# Patient Record
Sex: Male | Born: 1950 | Race: White | Hispanic: No | Marital: Married | Smoking: Never smoker
Health system: Southern US, Community
[De-identification: ages and names within clinical notes are randomized; demographics above are authoritative.]

## PROBLEM LIST (undated history)

## (undated) DIAGNOSIS — K5790 Diverticulosis of intestine, part unspecified, without perforation or abscess without bleeding: Secondary | ICD-10-CM

## (undated) DIAGNOSIS — C801 Malignant (primary) neoplasm, unspecified: Secondary | ICD-10-CM

## (undated) DIAGNOSIS — C20 Malignant neoplasm of rectum: Principal | ICD-10-CM

## (undated) DIAGNOSIS — J930 Spontaneous tension pneumothorax: Secondary | ICD-10-CM

## (undated) DIAGNOSIS — I10 Essential (primary) hypertension: Secondary | ICD-10-CM

## (undated) DIAGNOSIS — Z85048 Personal history of other malignant neoplasm of rectum, rectosigmoid junction, and anus: Secondary | ICD-10-CM

## (undated) DIAGNOSIS — R198 Other specified symptoms and signs involving the digestive system and abdomen: Secondary | ICD-10-CM

## (undated) DIAGNOSIS — Z85038 Personal history of other malignant neoplasm of large intestine: Secondary | ICD-10-CM

## (undated) DIAGNOSIS — M51379 Other intervertebral disc degeneration, lumbosacral region without mention of lumbar back pain or lower extremity pain: Secondary | ICD-10-CM

## (undated) DIAGNOSIS — I2510 Atherosclerotic heart disease of native coronary artery without angina pectoris: Secondary | ICD-10-CM

## (undated) DIAGNOSIS — Z8719 Personal history of other diseases of the digestive system: Secondary | ICD-10-CM

## (undated) DIAGNOSIS — K219 Gastro-esophageal reflux disease without esophagitis: Secondary | ICD-10-CM

## (undated) DIAGNOSIS — M5137 Other intervertebral disc degeneration, lumbosacral region: Secondary | ICD-10-CM

## (undated) DIAGNOSIS — E785 Hyperlipidemia, unspecified: Secondary | ICD-10-CM

## (undated) DIAGNOSIS — D131 Benign neoplasm of stomach: Secondary | ICD-10-CM

## (undated) DIAGNOSIS — M5030 Other cervical disc degeneration, unspecified cervical region: Secondary | ICD-10-CM

## (undated) DIAGNOSIS — K625 Hemorrhage of anus and rectum: Secondary | ICD-10-CM

## (undated) DIAGNOSIS — D126 Benign neoplasm of colon, unspecified: Secondary | ICD-10-CM

## (undated) HISTORY — DX: Malignant (primary) neoplasm, unspecified: C80.1

## (undated) HISTORY — PX: COLON SURGERY: SHX602

## (undated) HISTORY — PX: OTHER SURGICAL HISTORY: SHX169

## (undated) HISTORY — DX: Malignant neoplasm of rectum: C20

## (undated) HISTORY — DX: Essential (primary) hypertension: I10

## (undated) HISTORY — DX: Gastro-esophageal reflux disease without esophagitis: K21.9

---

## 2008-05-08 ENCOUNTER — Ambulatory Visit: Payer: Self-pay | Admitting: Radiation Oncology

## 2008-06-04 ENCOUNTER — Inpatient Hospital Stay: Payer: Self-pay | Admitting: Gastroenterology

## 2008-06-05 ENCOUNTER — Ambulatory Visit: Payer: Self-pay | Admitting: Radiation Oncology

## 2008-06-07 ENCOUNTER — Ambulatory Visit: Payer: Self-pay | Admitting: Internal Medicine

## 2008-06-10 ENCOUNTER — Ambulatory Visit: Payer: Self-pay | Admitting: Internal Medicine

## 2008-06-11 ENCOUNTER — Ambulatory Visit: Payer: Self-pay | Admitting: Internal Medicine

## 2008-07-08 ENCOUNTER — Ambulatory Visit: Payer: Self-pay | Admitting: Internal Medicine

## 2008-07-11 ENCOUNTER — Emergency Department: Payer: Self-pay | Admitting: Emergency Medicine

## 2008-08-07 ENCOUNTER — Ambulatory Visit: Payer: Self-pay | Admitting: Internal Medicine

## 2008-09-05 ENCOUNTER — Ambulatory Visit: Payer: Self-pay | Admitting: Surgery

## 2008-09-07 ENCOUNTER — Ambulatory Visit: Payer: Self-pay | Admitting: Internal Medicine

## 2008-09-12 ENCOUNTER — Inpatient Hospital Stay: Payer: Self-pay | Admitting: Surgery

## 2008-10-06 ENCOUNTER — Ambulatory Visit: Payer: Self-pay | Admitting: Surgery

## 2008-10-08 ENCOUNTER — Ambulatory Visit: Payer: Self-pay | Admitting: Internal Medicine

## 2008-10-10 ENCOUNTER — Ambulatory Visit: Payer: Self-pay | Admitting: Internal Medicine

## 2008-11-07 ENCOUNTER — Ambulatory Visit: Payer: Self-pay | Admitting: Internal Medicine

## 2008-12-08 ENCOUNTER — Ambulatory Visit: Payer: Self-pay | Admitting: Internal Medicine

## 2009-01-07 ENCOUNTER — Ambulatory Visit: Payer: Self-pay | Admitting: Internal Medicine

## 2009-02-07 ENCOUNTER — Ambulatory Visit: Payer: Self-pay | Admitting: Internal Medicine

## 2009-02-09 ENCOUNTER — Ambulatory Visit: Payer: Self-pay | Admitting: Cardiology

## 2009-03-10 ENCOUNTER — Ambulatory Visit: Payer: Self-pay | Admitting: Internal Medicine

## 2009-04-07 ENCOUNTER — Ambulatory Visit: Payer: Self-pay | Admitting: Internal Medicine

## 2009-05-08 ENCOUNTER — Ambulatory Visit: Payer: Self-pay | Admitting: Internal Medicine

## 2009-06-07 ENCOUNTER — Ambulatory Visit: Payer: Self-pay | Admitting: Internal Medicine

## 2009-07-08 ENCOUNTER — Ambulatory Visit: Payer: Self-pay | Admitting: Internal Medicine

## 2009-08-07 ENCOUNTER — Ambulatory Visit: Payer: Self-pay | Admitting: Internal Medicine

## 2009-09-07 ENCOUNTER — Ambulatory Visit: Payer: Self-pay | Admitting: Internal Medicine

## 2009-09-27 LAB — CEA: CEA: 3.4 ng/mL (ref 0.0–4.7)

## 2009-10-08 ENCOUNTER — Ambulatory Visit: Payer: Self-pay | Admitting: Internal Medicine

## 2009-10-20 ENCOUNTER — Ambulatory Visit: Payer: Self-pay | Admitting: Gastroenterology

## 2009-11-07 ENCOUNTER — Ambulatory Visit: Payer: Self-pay | Admitting: Internal Medicine

## 2009-11-27 LAB — CEA: CEA: 3.2 ng/mL (ref 0.0–4.7)

## 2009-12-08 ENCOUNTER — Ambulatory Visit: Payer: Self-pay | Admitting: Internal Medicine

## 2010-01-07 ENCOUNTER — Ambulatory Visit: Payer: Self-pay | Admitting: Internal Medicine

## 2010-02-07 ENCOUNTER — Ambulatory Visit: Payer: Self-pay | Admitting: Internal Medicine

## 2010-03-10 ENCOUNTER — Ambulatory Visit: Payer: Self-pay | Admitting: Internal Medicine

## 2010-04-08 ENCOUNTER — Ambulatory Visit: Payer: Self-pay | Admitting: Internal Medicine

## 2010-05-09 ENCOUNTER — Ambulatory Visit: Payer: Self-pay | Admitting: Internal Medicine

## 2010-06-08 ENCOUNTER — Ambulatory Visit: Payer: Self-pay | Admitting: Internal Medicine

## 2010-07-09 ENCOUNTER — Ambulatory Visit: Payer: Self-pay | Admitting: Internal Medicine

## 2010-08-08 ENCOUNTER — Ambulatory Visit: Payer: Self-pay | Admitting: Internal Medicine

## 2010-09-08 ENCOUNTER — Ambulatory Visit: Payer: Self-pay | Admitting: Internal Medicine

## 2010-10-09 ENCOUNTER — Ambulatory Visit: Payer: Self-pay | Admitting: Internal Medicine

## 2010-11-08 ENCOUNTER — Ambulatory Visit: Payer: Self-pay | Admitting: Internal Medicine

## 2010-11-25 LAB — PSA: PSA: 1.1 ng/mL (ref 0.0–4.0)

## 2010-12-09 ENCOUNTER — Ambulatory Visit: Payer: Self-pay | Admitting: Internal Medicine

## 2010-12-23 LAB — CEA: CEA: 3.6 ng/mL (ref 0.0–4.7)

## 2011-01-08 ENCOUNTER — Ambulatory Visit: Payer: Self-pay | Admitting: Internal Medicine

## 2011-02-08 ENCOUNTER — Ambulatory Visit: Payer: Self-pay | Admitting: Internal Medicine

## 2011-02-16 ENCOUNTER — Ambulatory Visit: Payer: Self-pay | Admitting: Internal Medicine

## 2011-02-22 ENCOUNTER — Ambulatory Visit: Payer: Self-pay | Admitting: Internal Medicine

## 2011-03-11 ENCOUNTER — Ambulatory Visit: Payer: Self-pay | Admitting: Internal Medicine

## 2011-03-17 LAB — CEA: CEA: 3.5 ng/mL (ref 0.0–4.7)

## 2011-04-08 ENCOUNTER — Ambulatory Visit: Payer: Self-pay | Admitting: Internal Medicine

## 2011-05-09 ENCOUNTER — Ambulatory Visit: Payer: Self-pay | Admitting: Internal Medicine

## 2011-05-19 LAB — HEPATIC FUNCTION PANEL A (ARMC)
Albumin: 3.7 g/dL (ref 3.4–5.0)
Bilirubin, Direct: 0.2 mg/dL (ref 0.00–0.20)
Bilirubin,Total: 0.4 mg/dL (ref 0.2–1.0)
SGOT(AST): 18 U/L (ref 15–37)
SGPT (ALT): 28 U/L
Total Protein: 7.1 g/dL (ref 6.4–8.2)

## 2011-05-20 LAB — CEA: CEA: 3.8 ng/mL

## 2011-06-08 ENCOUNTER — Ambulatory Visit: Payer: Self-pay | Admitting: Internal Medicine

## 2011-07-09 ENCOUNTER — Ambulatory Visit: Payer: Self-pay | Admitting: Internal Medicine

## 2011-07-22 LAB — CEA: CEA: 4 ng/mL (ref 0.0–4.7)

## 2011-08-08 ENCOUNTER — Ambulatory Visit: Payer: Self-pay | Admitting: Internal Medicine

## 2011-09-08 ENCOUNTER — Ambulatory Visit: Payer: Self-pay

## 2011-09-08 ENCOUNTER — Ambulatory Visit: Payer: Self-pay | Admitting: Internal Medicine

## 2011-10-13 ENCOUNTER — Ambulatory Visit: Payer: Self-pay | Admitting: Internal Medicine

## 2011-10-18 ENCOUNTER — Ambulatory Visit: Payer: Self-pay | Admitting: Gastroenterology

## 2011-10-18 HISTORY — PX: ABDOMINOPERINEAL PROCTOCOLECTOMY: SUR8

## 2011-11-08 ENCOUNTER — Ambulatory Visit: Payer: Self-pay | Admitting: Internal Medicine

## 2011-11-10 LAB — HEPATIC FUNCTION PANEL A (ARMC)
Albumin: 3.8 g/dL (ref 3.4–5.0)
Bilirubin, Direct: 0.1 mg/dL (ref 0.00–0.20)
Bilirubin,Total: 0.5 mg/dL (ref 0.2–1.0)
SGOT(AST): 17 U/L (ref 15–37)
SGPT (ALT): 22 U/L (ref 12–78)

## 2011-12-09 ENCOUNTER — Ambulatory Visit: Payer: Self-pay

## 2011-12-09 ENCOUNTER — Ambulatory Visit: Payer: Self-pay | Admitting: Internal Medicine

## 2012-01-08 ENCOUNTER — Ambulatory Visit: Payer: Self-pay | Admitting: Internal Medicine

## 2012-02-08 ENCOUNTER — Ambulatory Visit: Payer: Self-pay | Admitting: Internal Medicine

## 2012-02-16 LAB — CREATININE, SERUM: EGFR (Non-African Amer.): >60

## 2012-03-10 ENCOUNTER — Ambulatory Visit: Payer: Self-pay

## 2012-03-10 ENCOUNTER — Ambulatory Visit: Payer: Self-pay | Admitting: Internal Medicine

## 2012-03-15 LAB — CREATININE, SERUM
Creatinine: 1.01 mg/dL (ref 0.60–1.30)
EGFR (African American): >60
EGFR (Non-African Amer.): >60

## 2012-03-15 LAB — CANCER CENTER HEMOGLOBIN: HGB: 15.8 g/dL (ref 13.0–18.0)

## 2012-04-07 ENCOUNTER — Ambulatory Visit: Payer: Self-pay | Admitting: Internal Medicine

## 2012-05-08 ENCOUNTER — Ambulatory Visit: Payer: Self-pay | Admitting: Internal Medicine

## 2012-05-11 LAB — CEA: CEA: 4.4 ng/mL (ref 0.0–4.7)

## 2012-06-07 ENCOUNTER — Ambulatory Visit: Payer: Self-pay | Admitting: Internal Medicine

## 2012-07-08 ENCOUNTER — Ambulatory Visit: Payer: Self-pay | Admitting: Internal Medicine

## 2012-08-07 ENCOUNTER — Ambulatory Visit: Payer: Self-pay | Admitting: Internal Medicine

## 2012-09-07 ENCOUNTER — Ambulatory Visit: Payer: Self-pay | Admitting: Internal Medicine

## 2012-10-08 ENCOUNTER — Ambulatory Visit: Payer: Self-pay | Admitting: Internal Medicine

## 2012-11-08 ENCOUNTER — Ambulatory Visit: Payer: Self-pay | Admitting: Internal Medicine

## 2012-11-09 LAB — CEA: CEA: 3.7 ng/mL (ref 0.0–4.7)

## 2012-12-08 ENCOUNTER — Ambulatory Visit: Payer: Self-pay | Admitting: Internal Medicine

## 2013-01-07 ENCOUNTER — Ambulatory Visit: Payer: Self-pay | Admitting: Internal Medicine

## 2013-01-30 LAB — CEA: CEA: 4.3 ng/mL (ref 0.0–4.7)

## 2013-02-07 ENCOUNTER — Ambulatory Visit: Payer: Self-pay | Admitting: Internal Medicine

## 2013-03-13 ENCOUNTER — Ambulatory Visit: Payer: Self-pay | Admitting: Internal Medicine

## 2013-04-07 ENCOUNTER — Ambulatory Visit: Payer: Self-pay | Admitting: Internal Medicine

## 2013-05-08 ENCOUNTER — Ambulatory Visit: Payer: Self-pay | Admitting: Internal Medicine

## 2013-06-07 ENCOUNTER — Ambulatory Visit: Payer: Self-pay | Admitting: Internal Medicine

## 2013-06-07 LAB — CEA: CEA: 4.2 ng/mL (ref 0.0–4.7)

## 2013-07-18 ENCOUNTER — Ambulatory Visit: Payer: Self-pay | Admitting: Internal Medicine

## 2013-08-07 ENCOUNTER — Ambulatory Visit: Payer: Self-pay | Admitting: Internal Medicine

## 2013-09-07 ENCOUNTER — Ambulatory Visit: Payer: Self-pay | Admitting: Internal Medicine

## 2013-10-10 ENCOUNTER — Ambulatory Visit: Payer: Self-pay | Admitting: Internal Medicine

## 2013-10-11 LAB — CEA: CEA: 4.5 ng/mL (ref 0.0–4.7)

## 2013-11-07 ENCOUNTER — Ambulatory Visit: Payer: Self-pay | Admitting: Internal Medicine

## 2013-12-08 ENCOUNTER — Ambulatory Visit: Payer: Self-pay | Admitting: Internal Medicine

## 2014-01-07 ENCOUNTER — Ambulatory Visit: Payer: Self-pay | Admitting: Internal Medicine

## 2014-02-12 ENCOUNTER — Ambulatory Visit: Payer: Self-pay | Admitting: Internal Medicine

## 2014-02-14 LAB — CEA: CEA: 4.6 ng/mL (ref 0.0–4.7)

## 2014-03-10 ENCOUNTER — Ambulatory Visit: Payer: Self-pay | Admitting: Internal Medicine

## 2014-04-08 ENCOUNTER — Ambulatory Visit
Admit: 2014-04-08 | Disposition: A | Discharge status: Home / Self Care | Payer: Self-pay | Attending: Internal Medicine | Admitting: Internal Medicine

## 2014-05-08 LAB — CEA: CEA: 4.8 ng/mL — ABNORMAL HIGH (ref 0.0–4.7)

## 2014-05-09 ENCOUNTER — Ambulatory Visit
Admit: 2014-05-09 | Disposition: A | Discharge status: Home / Self Care | Payer: Self-pay | Attending: Internal Medicine | Admitting: Internal Medicine

## 2014-06-18 ENCOUNTER — Inpatient Hospital Stay: Payer: Medicare Other | Attending: Internal Medicine

## 2014-06-18 DIAGNOSIS — C801 Malignant (primary) neoplasm, unspecified: Secondary | ICD-10-CM

## 2014-06-18 DIAGNOSIS — K7689 Other specified diseases of liver: Secondary | ICD-10-CM | POA: Insufficient documentation

## 2014-06-18 DIAGNOSIS — Z79899 Other long term (current) drug therapy: Secondary | ICD-10-CM | POA: Insufficient documentation

## 2014-06-18 DIAGNOSIS — C20 Malignant neoplasm of rectum: Secondary | ICD-10-CM | POA: Diagnosis present

## 2014-06-18 MED ORDER — HEPARIN SOD (PORK) LOCK FLUSH 100 UNIT/ML IV SOLN
500.0000 [IU] | Freq: Once | INTRAVENOUS | Status: AC
  Administered 2014-06-18: 500 [IU] via INTRAVENOUS
  Filled 2014-06-18: qty 5

## 2014-06-18 MED ORDER — SODIUM CHLORIDE 0.9 % IJ SOLN
10.0000 mL | INTRAMUSCULAR | Status: DC | PRN
  Filled 2014-06-18: qty 10

## 2014-06-27 IMAGING — CT CT CHEST-ABD-PELV W/ CM
1 of 2 series · 15 of 31 positions shown, 19 images · IV contrast (isovue)
Comparison: 10/26/2010, 06/05/2008

REASON FOR EXAM: allergy will premedicate FU rectal Ca status post chemo
and surg
COMMENTS:

PROCEDURE:     CT  - CT CHEST ABDOMEN AND PELVIS W  - February 22, 2012  [DATE]
RESULT:     CT CHEST, ABDOMEN, AND PELVIS
HISTORY: Rectal carcinoma
TECHNIQUE: Multiple axial images obtained from the thoracic inlet to the
pubic symphysis, with p.o. contrast and with 100 ml of Isovue- 300
intravenous contrast.

[Series 2: soft tissue · axial · 0.81mm/px · z∈[+44,+647]mm · 15 of 225 slices shown, 19 images]
[im 12/225  mediastinal]
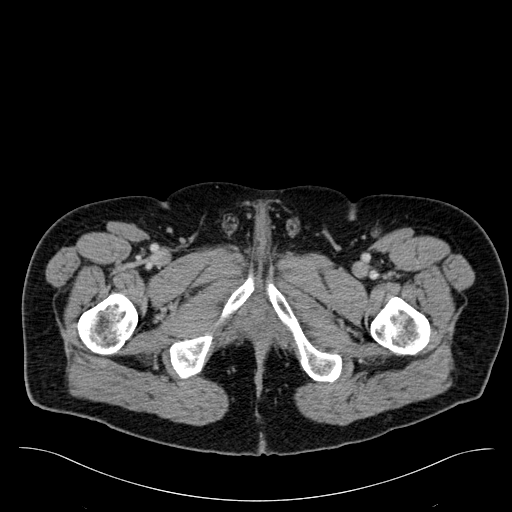
[im 12/225  bone]
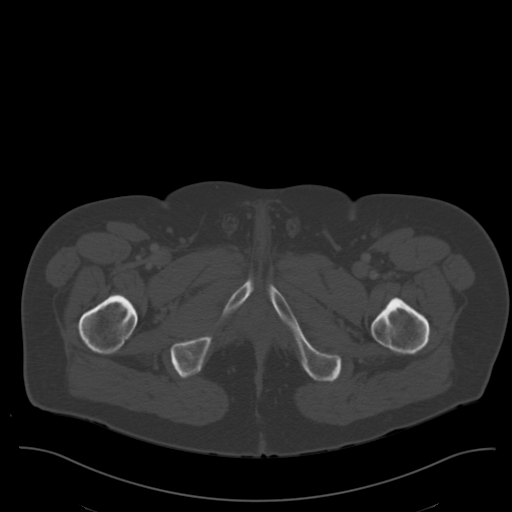
[im 36/225  mediastinal]
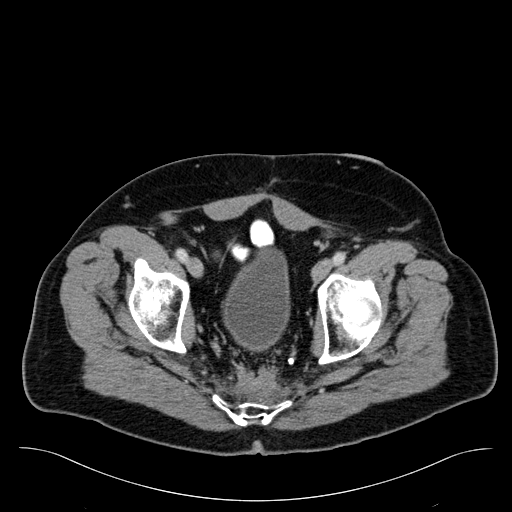
[im 59/225  mediastinal]
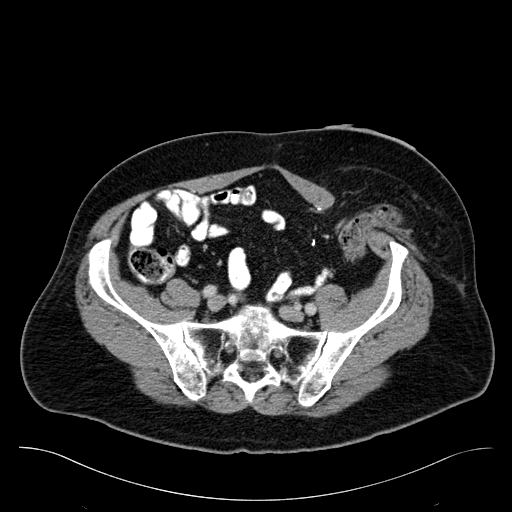
[im 71/225  mediastinal]
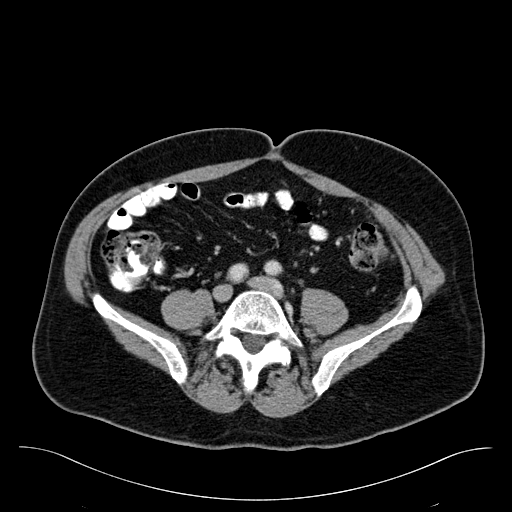
[im 83/225  mediastinal]
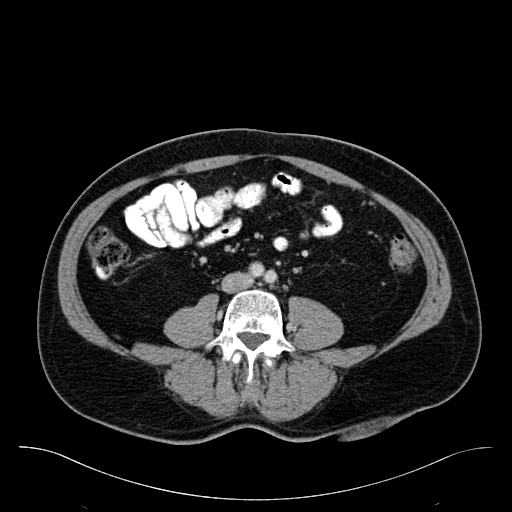
[im 95/225  mediastinal]
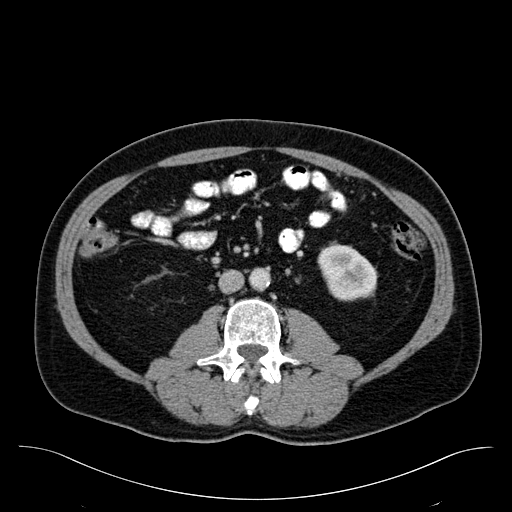
[im 110/225  mediastinal]
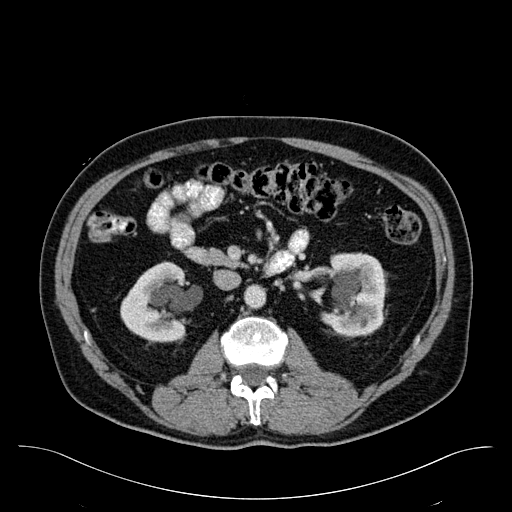
[im 130/225  mediastinal]
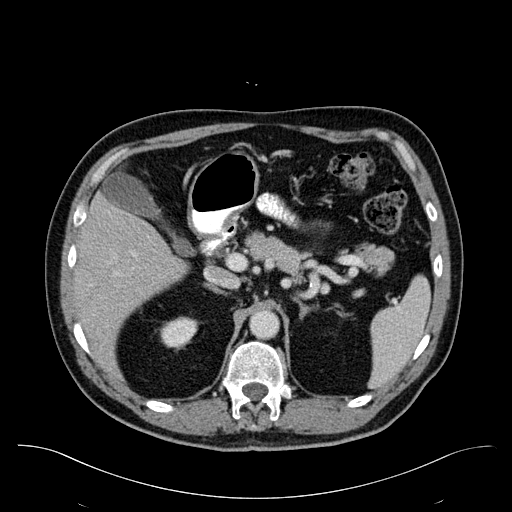
[im 142/225  mediastinal]
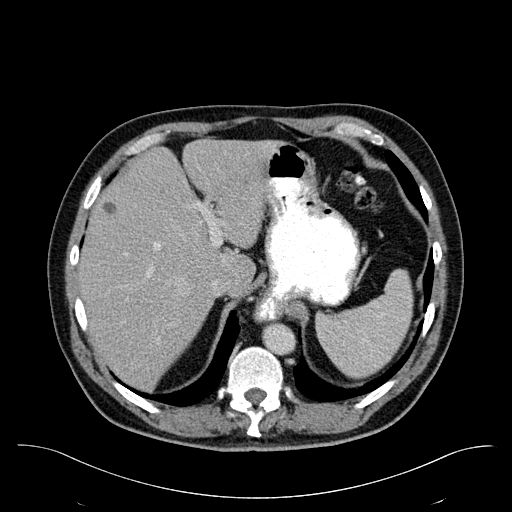
[im 142/225  bone]
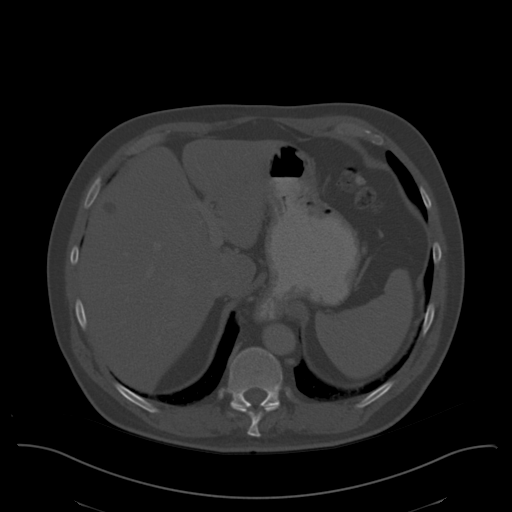
[im 154/225  mediastinal]
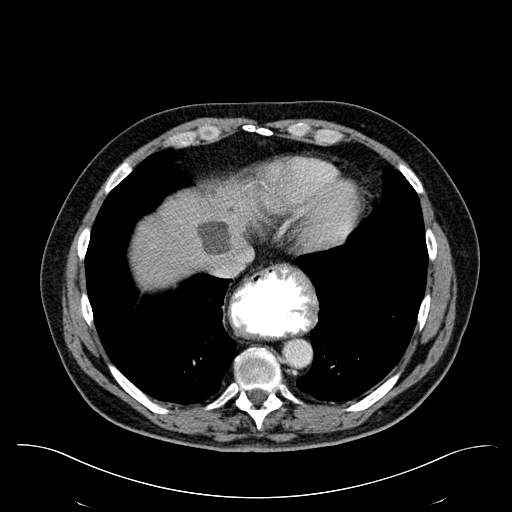
[im 166/225  mediastinal]
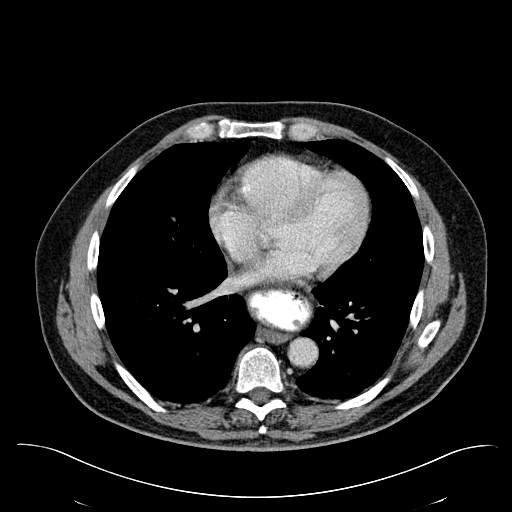
[im 177/225  lung]
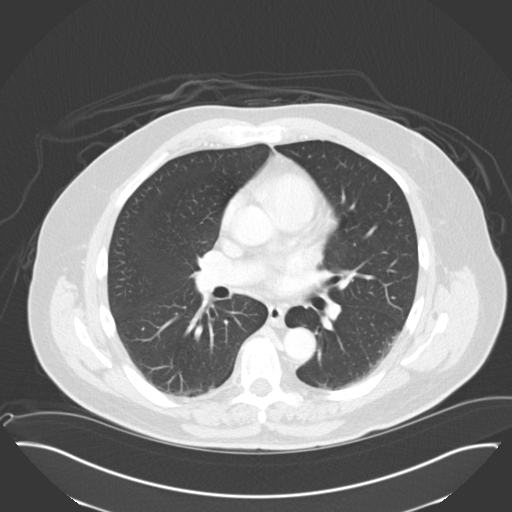
[im 189/225  mediastinal]
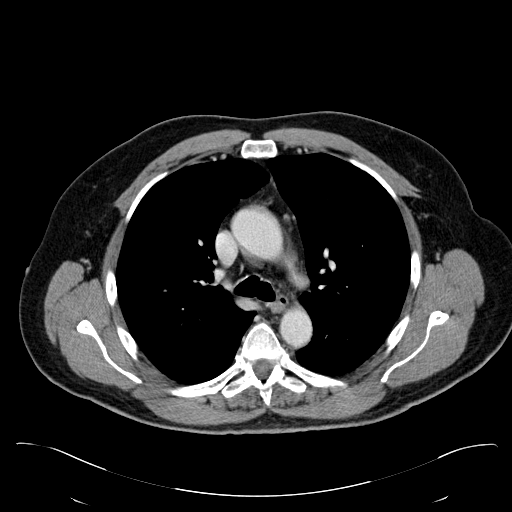
[im 189/225  lung]
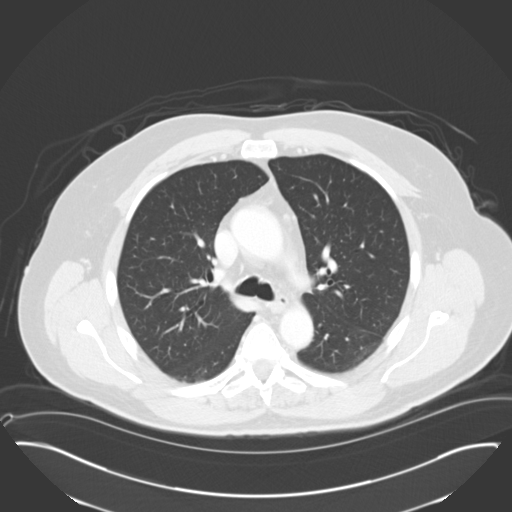
[im 201/225  lung]
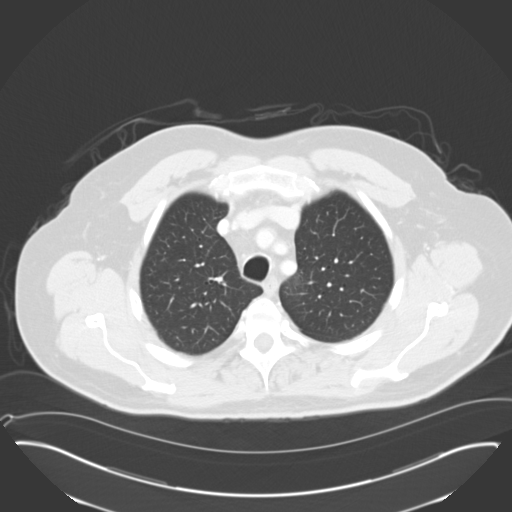
[im 213/225  mediastinal]
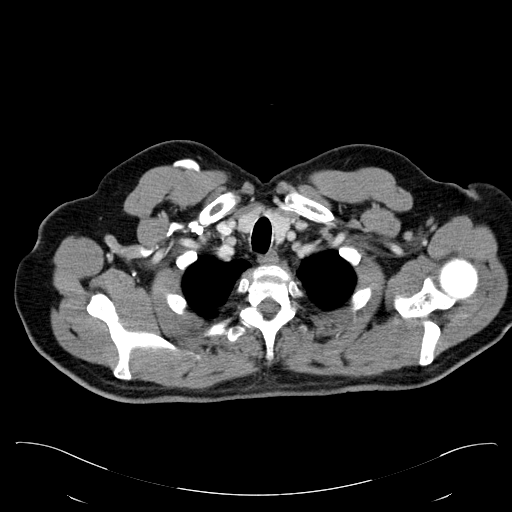
[im 213/225  lung]
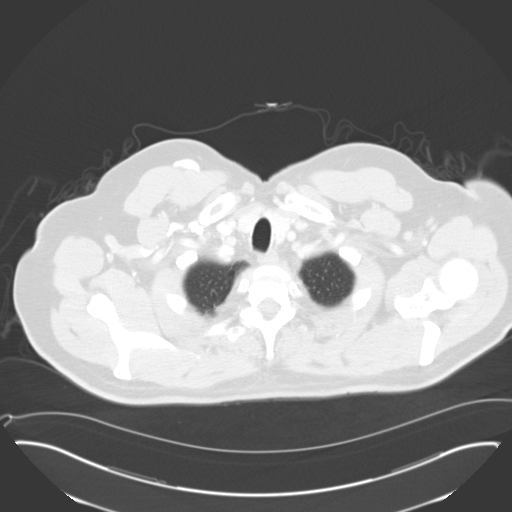

[15 of 31 positions shown; findings below may reference images not displayed]

FINDINGS: CHEST:

The lungs are clear. There is no focal mass. There is no focal parenchymal
opacity, pleural effusion, or pneumothorax.

The heart size is normal. There is no pericardial effusion.

There are no pathologically enlarged mediastinal, hilar, or axillary lymph
nodes.

The osseous structures demonstrate no focal abnormality.

ABDOMEN/PELVIS:

There are numerous hypodense, fluid attenuating hepatic masses which are
unchanged compared with or [id number] most consistent with cysts or hamartomas.
There is no intrahepatic or extrahepatic biliary ductal dilatation. The
gallbladder is unremarkable. The spleen demonstrates no focal abnormality.
There are bilateral parapelvic cysts. There is a nonobstructing left renal
calculus. adrenal glands, pancreas are normal. The bladder is unremarkable.

There is a large hiatal hernia. There is a left lower quadrant colostomy.
The  duodenum, small intestine, and large intestine demonstrate no contrast
extravasation or dilatation. There is no pneumoperitoneum, pneumatosis, or
portal venous gas. There is no abdominal or pelvic free fluid. There is no
lymphadenopathy. There is stable presacral soft tissue thickening likely
related to prior radiation therapy.

The abdominal aorta is normal in caliber with atherosclerosis.

The osseous structures are unremarkable.
IMPRESSION: 1. No CT evidence of recurrent or residual malignancy.

[REDACTED]

## 2014-07-30 ENCOUNTER — Inpatient Hospital Stay: Payer: Medicare Other | Attending: Family Medicine

## 2014-07-30 MED ORDER — HEPARIN SOD (PORK) LOCK FLUSH 100 UNIT/ML IV SOLN
INTRAVENOUS | Status: AC
Start: 2014-07-30 — End: 2014-07-30
  Filled 2014-07-30: qty 5

## 2014-09-10 ENCOUNTER — Other Ambulatory Visit: Payer: Self-pay

## 2014-09-10 ENCOUNTER — Ambulatory Visit: Payer: Self-pay | Admitting: Family Medicine

## 2014-09-11 ENCOUNTER — Encounter: Payer: Self-pay | Admitting: Family Medicine

## 2014-09-11 ENCOUNTER — Other Ambulatory Visit: Payer: Self-pay | Admitting: Family Medicine

## 2014-09-11 DIAGNOSIS — C20 Malignant neoplasm of rectum: Secondary | ICD-10-CM

## 2014-09-11 HISTORY — DX: Malignant neoplasm of rectum: C20

## 2014-09-12 ENCOUNTER — Inpatient Hospital Stay: Payer: Medicare Other | Attending: Family Medicine

## 2014-09-12 ENCOUNTER — Inpatient Hospital Stay (HOSPITAL_BASED_OUTPATIENT_CLINIC_OR_DEPARTMENT_OTHER): Payer: Medicare Other | Admitting: Family Medicine

## 2014-09-12 ENCOUNTER — Inpatient Hospital Stay: Payer: Medicare Other

## 2014-09-12 ENCOUNTER — Encounter: Payer: Self-pay | Admitting: Family Medicine

## 2014-09-12 VITALS — BP 123/85 | HR 86 | Temp 96.1°F | Resp 18 | Ht 69.0 in | Wt 198.8 lb

## 2014-09-12 DIAGNOSIS — Z923 Personal history of irradiation: Secondary | ICD-10-CM

## 2014-09-12 DIAGNOSIS — K449 Diaphragmatic hernia without obstruction or gangrene: Secondary | ICD-10-CM | POA: Insufficient documentation

## 2014-09-12 DIAGNOSIS — K219 Gastro-esophageal reflux disease without esophagitis: Secondary | ICD-10-CM | POA: Insufficient documentation

## 2014-09-12 DIAGNOSIS — C20 Malignant neoplasm of rectum: Secondary | ICD-10-CM

## 2014-09-12 DIAGNOSIS — Z79899 Other long term (current) drug therapy: Secondary | ICD-10-CM | POA: Diagnosis not present

## 2014-09-12 DIAGNOSIS — Z85048 Personal history of other malignant neoplasm of rectum, rectosigmoid junction, and anus: Secondary | ICD-10-CM | POA: Diagnosis not present

## 2014-09-12 DIAGNOSIS — I10 Essential (primary) hypertension: Secondary | ICD-10-CM | POA: Diagnosis not present

## 2014-09-12 DIAGNOSIS — Z9221 Personal history of antineoplastic chemotherapy: Secondary | ICD-10-CM | POA: Diagnosis not present

## 2014-09-12 DIAGNOSIS — J9383 Other pneumothorax: Secondary | ICD-10-CM | POA: Insufficient documentation

## 2014-09-12 DIAGNOSIS — Z452 Encounter for adjustment and management of vascular access device: Secondary | ICD-10-CM | POA: Diagnosis not present

## 2014-09-12 MED ORDER — SODIUM CHLORIDE 0.9 % IJ SOLN
10.0000 mL | INTRAMUSCULAR | Status: DC | PRN
  Administered 2014-09-12: 10 mL via INTRAVENOUS
  Filled 2014-09-12: qty 10

## 2014-09-12 MED ORDER — HEPARIN SOD (PORK) LOCK FLUSH 100 UNIT/ML IV SOLN
500.0000 [IU] | Freq: Once | INTRAVENOUS | Status: AC
  Administered 2014-09-12: 500 [IU] via INTRAVENOUS
  Filled 2014-09-12: qty 5

## 2014-09-12 NOTE — Progress Notes (Signed)
Jeffrey Roberson  [phone]  [phone]     Jeffrey Roberson DOB: 1950-03-18  MR#: [id number]  FXT#:[id number]  Patient Care Team: Dion Body, MD as PCP - General (Family Medicine)  CHIEF COMPLAINT:  Chief Complaint  Patient presents with  . Follow-up    History of Rectal Cancer   Patient with history of rectal cancer T3 N0 M0, clinically stage II. At that time he also had a small but nondiagnostic pelvic node, negative by PET scan. Status post treatment with concurrent chemotherapy and radiation. Patient completed short course of Xeloda with radiation, then abdominoperineal resection in 2010 with FOLFOX starting in April 2011.  INTERVAL HISTORY:  Patient is here for continued follow-up and further evaluation regarding a history of rectal cancer, previously treated in 2011. He overall reports feeling very good he denies any overall complaints. He is due for a colonoscopy with Dr. Vira Agar in September or October 2016. Patient reports he will have this scheduled as soon as possible. CEA was evaluated today and is stable.  REVIEW OF SYSTEMS:   Review of Systems  Constitutional: Negative for fever, chills, weight loss, malaise/fatigue and diaphoresis.  HENT: Negative for congestion, ear discharge, ear pain, hearing loss, nosebleeds, sore throat and tinnitus.   Eyes: Negative for blurred vision, double vision, photophobia, pain, discharge and redness.  Respiratory: Negative for cough, hemoptysis, sputum production, shortness of breath, wheezing and stridor.   Cardiovascular: Negative for chest pain, palpitations, orthopnea, claudication, leg swelling and PND.  Gastrointestinal: Negative for heartburn, nausea, vomiting, abdominal pain, diarrhea, constipation, blood in stool and melena.  Genitourinary: Negative.   Musculoskeletal: Negative.   Skin: Negative.   Neurological: Negative for dizziness, tingling, focal weakness, seizures, weakness and  headaches.  Endo/Heme/Allergies: Does not bruise/bleed easily.  Psychiatric/Behavioral: Negative for depression. The patient is not nervous/anxious and does not have insomnia.     As per HPI. Otherwise, a complete review of systems is negatve.  ONCOLOGY HISTORY:  No history exists.    PAST MEDICAL HISTORY: Past Medical History  Diagnosis Date  . Rectal cancer 09/11/2014  . Cancer     Rectal   . Hypertension   . GERD (gastroesophageal reflux disease)     PAST SURGICAL HISTORY: No past surgical history on file.  FAMILY HISTORY No family history on file.  GYNECOLOGIC HISTORY:  No LMP for male patient.     ADVANCED DIRECTIVES:    HEALTH MAINTENANCE: History  Substance Use Topics  . Smoking status: Never Smoker   . Smokeless tobacco: Never Used  . Alcohol Use: No     Colonoscopy:  PAP:  Bone density:  Lipid panel:  Allergies  Allergen Reactions  . Penicillin V Potassium Other (See Comments)    edema    Current Outpatient Prescriptions  Medication Sig Dispense Refill  . Cetirizine HCl 10 MG CAPS Take by mouth.    Marland Kitchen lisinopril-hydrochlorothiazide (PRINZIDE,ZESTORETIC) 10-12.5 MG per tablet TAKE 1 TABLET DAILY    . omeprazole (PRILOSEC) 20 MG capsule Take by mouth.     No current facility-administered medications for this visit.    OBJECTIVE: BP 123/85 mmHg  Pulse 86  Temp(Src) 96.1 F (35.6 C) (Tympanic)  Resp 18  Ht 5\' 9"  (1.753 m)  Wt 198 lb 12.8 oz (90.175 kg)  BMI 29.34 kg/m2   Body mass index is 29.34 kg/(m^2).    ECOG FS:0 - Asymptomatic  General: Well-developed, well-nourished, no acute distress. Eyes: Pink conjunctiva, anicteric sclera. HEENT: Normocephalic, moist mucous membranes,  clear oropharnyx. Lungs: Clear to auscultation bilaterally. Heart: Regular rate and rhythm. No rubs, murmurs, or gallops. Abdomen: Soft, nontender, nondistended. No organomegaly noted, normoactive bowel sounds. Musculoskeletal: No edema, cyanosis, or  clubbing. Neuro: Alert, answering all questions appropriately. Cranial nerves grossly intact. Skin: No rashes or petechiae noted. Psych: Normal affect. Lymphatics: No cervical, calvicular, axillary or inguinal LAD.   LAB RESULTS:  No visits with results within 3 Day(s) from this visit. Latest known visit with results is:  Hospital Outpatient Visit on 04/08/2014  Component Date Value Ref Range Status  . CEA 05/07/2014 4.8* 0.0-4.7 ng/mL Final   Comment:                        Roche ECLIA methodology       Nonsmokers  <3.9                                                     Smokers     <5.6            [facility]            No: [phone]           [address], [place], [place] [address]           Lindon Romp, MD         [phone] Result(s) reported on 08 May 2014 at 02:19PM.     STUDIES: No results found.  ASSESSMENT: Rectal cancer, stage II, T3 N0 M0   PLAN:   1. Rectal cancer. Patient initially diagnosed in May 2010, was treated with Xeloda and radiation treatment concurrently as patient did not want to have Port-A-Cath inserted at that time for 5-FU pump. Patient then had abdominoperineal resection in August 2010, followed by FOLFOX that was completed in April 2011. Clinically there are no signs of recurrent disease. CEA is stable at 4.7. We'll continue with every 6-8 week Port-A-Cath flushes. Patient understands to schedule colonoscopy as it is due in September 2016 with Dr. Vira Agar.  We'll continue with routine follow-up in this office in 6 months.  Patient expressed understanding and was in agreement with this plan. He also understands that He can call clinic at any time with any questions, concerns, or complaints.   Dr. Oliva Bustard was available for consultation and review of plan of care for this patient.   Evlyn Kanner, NP   09/12/2014 4:51 PM

## 2014-09-13 LAB — CEA: CEA: 4.7 ng/mL (ref 0.0–4.7)

## 2014-11-07 ENCOUNTER — Inpatient Hospital Stay: Payer: Medicare Other | Attending: Family Medicine

## 2014-11-07 DIAGNOSIS — Z923 Personal history of irradiation: Secondary | ICD-10-CM | POA: Insufficient documentation

## 2014-11-07 DIAGNOSIS — Z452 Encounter for adjustment and management of vascular access device: Secondary | ICD-10-CM | POA: Insufficient documentation

## 2014-11-07 DIAGNOSIS — Z9221 Personal history of antineoplastic chemotherapy: Secondary | ICD-10-CM | POA: Insufficient documentation

## 2014-11-07 DIAGNOSIS — Z23 Encounter for immunization: Secondary | ICD-10-CM | POA: Insufficient documentation

## 2014-11-07 DIAGNOSIS — C801 Malignant (primary) neoplasm, unspecified: Secondary | ICD-10-CM

## 2014-11-07 DIAGNOSIS — Z85048 Personal history of other malignant neoplasm of rectum, rectosigmoid junction, and anus: Secondary | ICD-10-CM | POA: Diagnosis present

## 2014-11-07 MED ORDER — SODIUM CHLORIDE 0.9 % IJ SOLN
10.0000 mL | Freq: Once | INTRAMUSCULAR | Status: AC
  Administered 2014-11-07: 10 mL via INTRAVENOUS
  Filled 2014-11-07: qty 10

## 2014-11-07 MED ORDER — INFLUENZA VAC SPLIT QUAD 0.5 ML IM SUSY
0.5000 mL | PREFILLED_SYRINGE | Freq: Once | INTRAMUSCULAR | Status: AC
  Administered 2014-11-07: 0.5 mL via INTRAMUSCULAR
  Filled 2014-11-07: qty 0.5

## 2014-11-07 MED ORDER — HEPARIN SOD (PORK) LOCK FLUSH 100 UNIT/ML IV SOLN
500.0000 [IU] | Freq: Once | INTRAVENOUS | Status: AC
  Administered 2014-11-07: 500 [IU] via INTRAVENOUS
  Filled 2014-11-07: qty 5

## 2014-12-22 ENCOUNTER — Encounter: Payer: Self-pay | Admitting: *Deleted

## 2014-12-23 ENCOUNTER — Ambulatory Visit: Payer: Medicare Other | Admitting: Anesthesiology

## 2014-12-23 ENCOUNTER — Encounter: Payer: Self-pay | Admitting: *Deleted

## 2014-12-23 ENCOUNTER — Encounter
Admission: RE | Disposition: A | Discharge status: Home / Self Care | Payer: Self-pay | Source: Ambulatory Visit | Attending: Gastroenterology

## 2014-12-23 ENCOUNTER — Ambulatory Visit
Admission: RE | Admit: 2014-12-23 | Discharge: 2014-12-23 | Disposition: A | Discharge status: Home / Self Care | Payer: Medicare Other | Source: Ambulatory Visit | Attending: Gastroenterology | Admitting: Gastroenterology

## 2014-12-23 DIAGNOSIS — K219 Gastro-esophageal reflux disease without esophagitis: Secondary | ICD-10-CM | POA: Insufficient documentation

## 2014-12-23 DIAGNOSIS — K5730 Diverticulosis of large intestine without perforation or abscess without bleeding: Secondary | ICD-10-CM | POA: Insufficient documentation

## 2014-12-23 DIAGNOSIS — Z79899 Other long term (current) drug therapy: Secondary | ICD-10-CM | POA: Insufficient documentation

## 2014-12-23 DIAGNOSIS — I2510 Atherosclerotic heart disease of native coronary artery without angina pectoris: Secondary | ICD-10-CM | POA: Diagnosis not present

## 2014-12-23 DIAGNOSIS — K635 Polyp of colon: Secondary | ICD-10-CM | POA: Insufficient documentation

## 2014-12-23 DIAGNOSIS — I10 Essential (primary) hypertension: Secondary | ICD-10-CM | POA: Insufficient documentation

## 2014-12-23 DIAGNOSIS — Z86010 Personal history of colonic polyps: Secondary | ICD-10-CM | POA: Diagnosis present

## 2014-12-23 DIAGNOSIS — Z85048 Personal history of other malignant neoplasm of rectum, rectosigmoid junction, and anus: Secondary | ICD-10-CM | POA: Diagnosis not present

## 2014-12-23 DIAGNOSIS — Z880 Allergy status to penicillin: Secondary | ICD-10-CM | POA: Diagnosis not present

## 2014-12-23 HISTORY — DX: Hemorrhage of anus and rectum: K62.5

## 2014-12-23 HISTORY — PX: COLONOSCOPY: SHX5424

## 2014-12-23 HISTORY — DX: Spontaneous tension pneumothorax: J93.0

## 2014-12-23 HISTORY — DX: Benign neoplasm of stomach: D13.1

## 2014-12-23 HISTORY — DX: Diverticulosis of intestine, part unspecified, without perforation or abscess without bleeding: K57.90

## 2014-12-23 HISTORY — DX: Other cervical disc degeneration, unspecified cervical region: M50.30

## 2014-12-23 HISTORY — DX: Personal history of other diseases of the digestive system: Z87.19

## 2014-12-23 HISTORY — DX: Atherosclerotic heart disease of native coronary artery without angina pectoris: I25.10

## 2014-12-23 SURGERY — COLONOSCOPY
Anesthesia: General

## 2014-12-23 MED ORDER — FENTANYL CITRATE (PF) 100 MCG/2ML IJ SOLN
INTRAMUSCULAR | Status: DC | PRN
  Administered 2014-12-23: 50 ug via INTRAVENOUS

## 2014-12-23 MED ORDER — SODIUM CHLORIDE 0.9 % IV SOLN
INTRAVENOUS | Status: DC
  Administered 2014-12-23 (×2): via INTRAVENOUS

## 2014-12-23 MED ORDER — PROPOFOL 10 MG/ML IV BOLUS
INTRAVENOUS | Status: DC | PRN
Start: 2014-12-23 — End: 2014-12-23
  Administered 2014-12-23: 50 mg via INTRAVENOUS

## 2014-12-23 MED ORDER — MIDAZOLAM HCL 5 MG/5ML IJ SOLN
INTRAMUSCULAR | Status: DC | PRN
  Administered 2014-12-23: 2 mg via INTRAVENOUS

## 2014-12-23 MED ORDER — PROPOFOL 500 MG/50ML IV EMUL
INTRAVENOUS | Status: DC | PRN
  Administered 2014-12-23: 140 ug/kg/min via INTRAVENOUS

## 2014-12-23 MED ORDER — SODIUM CHLORIDE 0.9 % IV SOLN
INTRAVENOUS | Status: DC
Start: 2014-12-23 — End: 2014-12-25

## 2014-12-23 NOTE — H&P (Signed)
Outpatient short stay form Pre-procedure 12/23/2014 9:27 AM Jeffrey Sails MD  Primary Physician: Dr. Fulton Reek  Reason for visit:  Colonoscopy  History of present illness:  Jeffrey Roberson is a 64 year old male presenting today for a colonoscopy. His last colonoscopy was 06/28/2011. He has a history of rectal cancer with section and colostomy 2010. He tolerated his prep well. He denies use of aspirin products or blood thinners.    Current facility-administered medications:  .  0.9 %  sodium chloride infusion, , Intravenous, Continuous, Jeffrey Sails, MD, Last Rate: 20 mL/hr at 12/23/14 0828 .  0.9 %  sodium chloride infusion, , Intravenous, Continuous, Jeffrey Sails, MD  Prescriptions prior to admission  Medication Sig Dispense Refill Last Dose  . lisinopril-hydrochlorothiazide (PRINZIDE,ZESTORETIC) 10-12.5 MG per tablet TAKE 1 TABLET DAILY   12/23/2014 at 0630  . Cetirizine HCl 10 MG CAPS Take by mouth.   Taking  . omeprazole (PRILOSEC) 20 MG capsule Take by mouth.   Taking     Allergies  Allergen Reactions  . Penicillin V Potassium Other (See Comments)    edema     Past Medical History  Diagnosis Date  . Rectal cancer ([facility]) 09/11/2014  . Cancer ([facility])     Rectal   . Hypertension   . GERD (gastroesophageal reflux disease)   . Coronary artery disease   . DDD (degenerative disc disease), cervical   . Diverticulosis   . Fundic gland polyps of stomach, benign   . Hemorrhage of rectum and anus   . History of hiatal hernia   . Pneumothorax, spontaneous, tension     Review of systems:      Physical Exam    Heart and lungs: Regular rate and rhythm without rub or gallop, lungs are bilaterally clear    HEENT: Norm cephalic atraumatic eyes are anicteric    Other:     Pertinant exam for procedure: Soft nontender nondistended bowel sounds positive normoactive. There is a ostomy site in the lower quadrant.    Planned proceedures: Colonoscopy and indicated  procedures I have discussed the risks benefits and complications of procedures to include not limited to bleeding, infection, perforation and the risk of sedation and the patient wishes to proceed.    Jeffrey Sails, MD Gastroenterology 12/23/2014  9:27 AM

## 2014-12-23 NOTE — Transfer of Care (Signed)
Immediate Anesthesia Transfer of Care Note  Patient: Jeffrey Roberson  Procedure(s) Performed: Procedure(s): COLONOSCOPY (N/A)  Patient Location: PACU  Anesthesia Type:General  Level of Consciousness: awake  Airway & Oxygen Therapy: Patient Spontanous Breathing and Patient connected to nasal cannula oxygen  Post-op Assessment: Report given to RN  Post vital signs: Reviewed and stable  Last Vitals:  Filed Vitals:   12/23/14 0807  BP: 147/97  Pulse: 98  Temp: 36.3 C  Resp: 20    Complications: No apparent anesthesia complications

## 2014-12-23 NOTE — Progress Notes (Signed)
   12/23/14 1000  Aldrete  Activity 2  Respiration 2  Circulation 2  Consciousness 2  Color 2  Aldrete Score 10

## 2014-12-23 NOTE — Anesthesia Preprocedure Evaluation (Signed)
Anesthesia Evaluation  Patient identified by MRN, date of birth, ID band Patient awake    Reviewed: Allergy & Precautions, NPO status   Airway Mallampati: III       Dental no notable dental hx.    Pulmonary neg pulmonary ROS,    Pulmonary exam normal        Cardiovascular hypertension, + CAD   Rate:Bradycardia     Neuro/Psych negative neurological ROS     GI/Hepatic Neg liver ROS, hiatal hernia, GERD  ,  Endo/Other  negative endocrine ROS  Renal/GU negative Renal ROS     Musculoskeletal   Abdominal Normal abdominal exam  (+)   Peds  Hematology negative hematology ROS (+)   Anesthesia Other Findings   Reproductive/Obstetrics                             Anesthesia Physical Anesthesia Plan  ASA: II  Anesthesia Plan: General   Post-op Pain Management:    Induction: Intravenous  Airway Management Planned: Nasal Cannula  Additional Equipment:   Intra-op Plan:   Post-operative Plan:   Informed Consent: I have reviewed the patients History and Physical, chart, labs and discussed the procedure including the risks, benefits and alternatives for the proposed anesthesia with the patient or authorized representative who has indicated his/her understanding and acceptance.     Plan Discussed with: CRNA  Anesthesia Plan Comments:         Anesthesia Quick Evaluation

## 2014-12-23 NOTE — Op Note (Signed)
[facility] Gastroenterology Patient Name: Bilal Kaller Procedure Date: 12/23/2014 9:13 AM MRN: [id number] Account #: [id number] Date of Birth: 12/23/1950 Admit Type: Outpatient Age: 64 Room: [facility] ENDO ROOM 3 Gender: Male Note Status: Finalized Procedure:         Colonoscopy Indications:       Personal history of malignant rectal neoplasm, Personal                     history of colonic polyps Providers:         Lollie Sails, MD Referring MD:      Leonie Douglas. Doy Hutching, MD (Referring MD) Medicines:         Monitored Anesthesia Care Complications:     No immediate complications. Procedure:         Pre-Anesthesia Assessment:                    - ASA Grade Assessment: III - A patient with severe                     systemic disease.                    After obtaining informed consent, the colonoscope was                     passed under direct vision. Throughout the procedure, the                     patient's blood pressure, pulse, and oxygen saturations                     were monitored continuously. The Colonoscope was                     introduced through the sigmoid colostomy and advanced to                     the the cecum, identified by appendiceal orifice and                     ileocecal valve. The colonoscopy was performed without                     difficulty. The patient tolerated the procedure well. The                     quality of the bowel preparation was good. Findings:      A 3 mm polyp was found at 12 cm proximal to the stoma. The polyp was       flat. The polyp was removed with a cold biopsy forceps. Resection and       retrieval were complete.      Multiple small-mouthed diverticula were found in the sigmoid colon, in       the descending colon and in the transverse colon.      No additional abnormalities were found on retroflexion.      peristomal exam showing several small spots of friable granulation       tissue.      The  exam was otherwise normal throughout the examined colon.      there is a small opening at the area of the rectal orifice but cannot be       intubated with a scope or fingertip due to small  size. Impression:        - One 3 mm polyp at 12 cm proximal to the stoma. Resected                     and retrieved.                    - Diverticulosis in the sigmoid colon, in the descending                     colon and in the transverse colon. Recommendation:    - Await pathology results.                    - Discharge patient to home. Procedure Code(s): --- Professional ---                    [phone], Colonoscopy through stoma; with biopsy, single or                     multiple Diagnosis Code(s): --- Professional ---                    D12.6, Benign neoplasm of colon, unspecified                    Z85.048, Personal history of other malignant neoplasm of                     rectum, rectosigmoid junction, and anus                    Z86.010, Personal history of colonic polyps                    K57.30, Diverticulosis of large intestine without                     perforation or abscess without bleeding CPT copyright 2014 [employer]. All rights reserved. The codes documented in this report are preliminary and upon coder review may  be revised to meet current compliance requirements. Lollie Sails, MD 12/23/2014 10:21:39 AM This report has been signed electronically. Number of Addenda: 0 Note Initiated On: 12/23/2014 9:13 AM Scope Withdrawal Time: 0 hours 11 minutes 51 seconds  Total Procedure Duration: 0 hours 23 minutes 41 seconds       [facility]

## 2014-12-23 NOTE — Anesthesia Postprocedure Evaluation (Signed)
  Anesthesia Post-op Note  Patient: Jeffrey Roberson  Procedure(s) Performed: Procedure(s): COLONOSCOPY (N/A)  Anesthesia type:General  Patient location: PACU  Post pain: Pain level controlled  Post assessment: Post-op Vital signs reviewed, Patient's Cardiovascular Status Stable, Respiratory Function Stable, Patent Airway and No signs of Nausea or vomiting  Post vital signs: Reviewed and stable  Last Vitals:  Filed Vitals:   12/23/14 1100  BP: 107/87  Pulse: 83  Temp:   Resp: 19    Level of consciousness: awake, alert  and patient cooperative  Complications: No apparent anesthesia complications

## 2014-12-24 ENCOUNTER — Encounter: Payer: Self-pay | Admitting: Gastroenterology

## 2014-12-24 LAB — SURGICAL PATHOLOGY

## 2015-01-02 ENCOUNTER — Inpatient Hospital Stay: Payer: Medicare Other

## 2015-01-02 NOTE — Addendum Note (Signed)
Addendum  created 01/02/15 1526 by Iver Nestle, MD   Modules edited: Anesthesia Attestations

## 2015-01-09 ENCOUNTER — Inpatient Hospital Stay: Payer: Medicare Other | Attending: Family Medicine

## 2015-01-09 DIAGNOSIS — Z452 Encounter for adjustment and management of vascular access device: Secondary | ICD-10-CM | POA: Insufficient documentation

## 2015-01-09 DIAGNOSIS — Z85048 Personal history of other malignant neoplasm of rectum, rectosigmoid junction, and anus: Secondary | ICD-10-CM | POA: Diagnosis present

## 2015-01-09 DIAGNOSIS — C801 Malignant (primary) neoplasm, unspecified: Secondary | ICD-10-CM

## 2015-01-09 MED ORDER — HEPARIN SOD (PORK) LOCK FLUSH 100 UNIT/ML IV SOLN
500.0000 [IU] | Freq: Once | INTRAVENOUS | Status: AC
  Administered 2015-01-09: 500 [IU] via INTRAVENOUS

## 2015-01-09 MED ORDER — HEPARIN SOD (PORK) LOCK FLUSH 100 UNIT/ML IV SOLN
INTRAVENOUS | Status: AC
  Filled 2015-01-09: qty 5

## 2015-01-09 MED ORDER — SODIUM CHLORIDE 0.9 % IJ SOLN
10.0000 mL | Freq: Once | INTRAMUSCULAR | Status: AC
  Administered 2015-01-09: 10 mL via INTRAVENOUS
  Filled 2015-01-09: qty 10

## 2015-03-13 ENCOUNTER — Other Ambulatory Visit: Payer: Self-pay | Admitting: *Deleted

## 2015-03-13 DIAGNOSIS — C20 Malignant neoplasm of rectum: Secondary | ICD-10-CM

## 2015-03-16 ENCOUNTER — Inpatient Hospital Stay (HOSPITAL_BASED_OUTPATIENT_CLINIC_OR_DEPARTMENT_OTHER): Payer: Medicare Other | Admitting: Internal Medicine

## 2015-03-16 ENCOUNTER — Inpatient Hospital Stay: Payer: Medicare Other | Attending: Internal Medicine

## 2015-03-16 ENCOUNTER — Encounter: Payer: Self-pay | Admitting: Internal Medicine

## 2015-03-16 VITALS — BP 146/86 | HR 94 | Temp 97.4°F | Resp 18 | Ht 69.0 in | Wt 196.4 lb

## 2015-03-16 DIAGNOSIS — C20 Malignant neoplasm of rectum: Secondary | ICD-10-CM | POA: Diagnosis not present

## 2015-03-16 DIAGNOSIS — Z9221 Personal history of antineoplastic chemotherapy: Secondary | ICD-10-CM

## 2015-03-16 DIAGNOSIS — I10 Essential (primary) hypertension: Secondary | ICD-10-CM | POA: Diagnosis not present

## 2015-03-16 DIAGNOSIS — Z79899 Other long term (current) drug therapy: Secondary | ICD-10-CM | POA: Diagnosis not present

## 2015-03-16 DIAGNOSIS — Z923 Personal history of irradiation: Secondary | ICD-10-CM | POA: Diagnosis not present

## 2015-03-16 DIAGNOSIS — I2510 Atherosclerotic heart disease of native coronary artery without angina pectoris: Secondary | ICD-10-CM | POA: Insufficient documentation

## 2015-03-16 DIAGNOSIS — K317 Polyp of stomach and duodenum: Secondary | ICD-10-CM | POA: Diagnosis not present

## 2015-03-16 DIAGNOSIS — K219 Gastro-esophageal reflux disease without esophagitis: Secondary | ICD-10-CM | POA: Insufficient documentation

## 2015-03-16 LAB — COMPREHENSIVE METABOLIC PANEL
ALBUMIN: 4.4 g/dL (ref 3.5–5.0)
ALT: 23 U/L (ref 17–63)
AST: 20 U/L (ref 15–41)
Alkaline Phosphatase: 66 U/L (ref 38–126)
Anion gap: 7 (ref 5–15)
BUN: 24 mg/dL — AB (ref 6–20)
CHLORIDE: 100 mmol/L — AB (ref 101–111)
CO2: 26 mmol/L (ref 22–32)
Calcium: 9 mg/dL (ref 8.9–10.3)
Creatinine, Ser: 0.9 mg/dL (ref 0.61–1.24)
GFR calc Af Amer: >60 mL/min (ref >60)
GFR calc non Af Amer: >60 mL/min (ref >60)
GLUCOSE: 112 mg/dL — AB (ref 65–99)
POTASSIUM: 3.9 mmol/L (ref 3.5–5.1)
Sodium: 133 mmol/L — ABNORMAL LOW (ref 135–145)
Total Bilirubin: 0.8 mg/dL (ref 0.3–1.2)
Total Protein: 7.1 g/dL (ref 6.5–8.1)

## 2015-03-16 LAB — CBC WITH DIFFERENTIAL/PLATELET
BASOS ABS: 0.1 10*3/uL (ref 0–0.1)
BASOS PCT: 1 %
EOS PCT: 1 %
Eosinophils Absolute: 0.1 10*3/uL (ref 0–0.7)
HCT: 48.3 % (ref 40.0–52.0)
Hemoglobin: 16.6 g/dL (ref 13.0–18.0)
Lymphocytes Relative: 13 %
Lymphs Abs: 0.8 10*3/uL — ABNORMAL LOW (ref 1.0–3.6)
MCH: 29.2 pg (ref 26.0–34.0)
MCHC: 34.3 g/dL (ref 32.0–36.0)
MCV: 85.1 fL (ref 80.0–100.0)
MONO ABS: 0.7 10*3/uL (ref 0.2–1.0)
Monocytes Relative: 11 %
Neutro Abs: 4.3 10*3/uL (ref 1.4–6.5)
Neutrophils Relative %: 74 %
PLATELETS: 213 10*3/uL (ref 150–440)
RBC: 5.67 MIL/uL (ref 4.40–5.90)
RDW: 14.2 % (ref 11.5–14.5)
WBC: 5.9 10*3/uL (ref 3.8–10.6)

## 2015-03-16 NOTE — Progress Notes (Signed)
Pt not having any problems, no pain, wt consistent. Still had port and he wants it removed after speaking to md. Will contact dr. Tamala Julian. Colostomy working the same

## 2015-03-16 NOTE — Progress Notes (Signed)
[place]  [phone]  [phone]     Jeffrey Roberson DOB: October 06, 1950  MR#: [id number]  JOI#:[id number]  Patient Care Team: Katheren Shams as PCP - General  CHIEF COMPLAINT:  No chief complaint on file.  Patient with history of rectal cancer T3 N0 M0, clinically stage II. At that time he also had a small but nondiagnostic pelvic node, negative by PET scan. Status post treatment with concurrent chemotherapy and radiation. Patient completed short course of Xeloda with radiation, then abdominoperineal resection in 2010 with FOLFOX starting in April 2011.  INTERVAL HISTORY:  Jeffrey Roberson returns to our clinic for a follow-up visit. He has done very well since his last appointment, without any significant complaints or health-related problems. He continues to perform all activities of daily living without difficulties. He denies weight loss, bleeding from the colostomy, change in the color of stool, diarrhea. He had a colonoscopy in November 2016, which showed a small tubular adenoma.  REVIEW OF SYSTEMS:   Review of Systems  Constitutional: Negative for fever, chills, weight loss, malaise/fatigue and diaphoresis.  HENT: Negative for congestion, ear discharge, ear pain, hearing loss, nosebleeds, sore throat and tinnitus.   Eyes: Negative for blurred vision, double vision, photophobia, pain, discharge and redness.  Respiratory: Negative for cough, hemoptysis, sputum production, shortness of breath, wheezing and stridor.   Cardiovascular: Negative for chest pain, palpitations, orthopnea, claudication, leg swelling and PND.  Gastrointestinal: Negative for heartburn, nausea, vomiting, abdominal pain, diarrhea, constipation, blood in stool and melena.  Genitourinary: Negative.   Musculoskeletal: Negative.   Skin: Negative.   Neurological: Negative for dizziness, tingling, focal weakness, seizures, weakness and headaches.  Endo/Heme/Allergies:  Does not bruise/bleed easily.  Psychiatric/Behavioral: Negative for depression. The patient is not nervous/anxious and does not have insomnia.     As per HPI. Otherwise, a complete review of systems is negatve.  ONCOLOGY HISTORY: Oncology History   Patient with history of rectal cancer T3 N0 M0, clinically stage II. At that time he also had a small but nondiagnostic pelvic node, negative by PET scan. Status post treatment with concurrent chemotherapy and radiation. Patient completed short course of Xeloda with radiation, then abdominoperineal resection in 2010 with FOLFOX starting in April 2011.     Rectal cancer ([place])   09/11/2014 Initial Diagnosis Rectal cancer    PAST MEDICAL HISTORY: Past Medical History  Diagnosis Date  . Rectal cancer ([place]) 09/11/2014  . Cancer ([facility])     Rectal   . Hypertension   . GERD (gastroesophageal reflux disease)   . Coronary artery disease   . DDD (degenerative disc disease), cervical   . Diverticulosis   . Fundic gland polyps of stomach, benign   . Hemorrhage of rectum and anus   . History of hiatal hernia   . Pneumothorax, spontaneous, tension     PAST SURGICAL HISTORY: Past Surgical History  Procedure Laterality Date  . Colon surgery    . Abdominoperineal proctocolectomy  10/18/11  . Colonoscopy N/A 12/23/2014    Procedure: COLONOSCOPY;  Surgeon: Lollie Sails, MD;  Location: [facility] ENDOSCOPY;  Service: Endoscopy;  Laterality: N/A;    FAMILY HISTORY No family history on file.  GYNECOLOGIC HISTORY:  No LMP for male patient.     ADVANCED DIRECTIVES:    HEALTH MAINTENANCE: Social History  Substance Use Topics  . Smoking status: Never Smoker   . Smokeless tobacco: Never Used  . Alcohol Use: No     Colonoscopy:  PAP:  Bone  density:  Lipid panel:  Allergies  Allergen Reactions  . Penicillin V Potassium Other (See Comments)    edema    Current Outpatient Prescriptions  Medication Sig Dispense Refill  . Cetirizine HCl 10 MG  CAPS Take by mouth.    Marland Kitchen lisinopril-hydrochlorothiazide (PRINZIDE,ZESTORETIC) 10-12.5 MG per tablet TAKE 1 TABLET DAILY    . omeprazole (PRILOSEC) 20 MG capsule Take by mouth.     No current facility-administered medications for this visit.    OBJECTIVE: BP 146/86 mmHg  Pulse 94  Temp(Src) 97.4 F (36.3 C) (Tympanic)  Resp 18  Ht 5' 9"  (1.753 m)  Wt 196 lb 6.9 oz (89.1 kg)  BMI 28.99 kg/m2   Body mass index is 28.99 kg/(m^2).    ECOG FS:0 - Asymptomatic  General: Well-developed, well-nourished, no acute distress. Eyes: Pink conjunctiva, anicteric sclera. HEENT: Normocephalic, moist mucous membranes, clear oropharnyx. Lungs: Clear to auscultation bilaterally. Heart: Regular rate and rhythm. No rubs, murmurs, or gallops. Abdomen: Soft, nontender, nondistended. No organomegaly noted, normoactive bowel sounds. 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 No bleeding. Musculoskeletal: No edema, cyanosis, or clubbing. Neuro: Alert, answering all questions appropriately. Cranial nerves grossly intact. Skin: No rashes or petechiae noted. Psych: Normal affect. Lymphatics: No cervical, calvicular, axillary or inguinal LAD.   LAB RESULTS:  Appointment on 03/16/2015  Component Date Value Ref Range Status  . WBC 03/16/2015 5.9  3.8 - 10.6 K/uL Final  . RBC 03/16/2015 5.67  4.40 - 5.90 MIL/uL Final  . Hemoglobin 03/16/2015 16.6  13.0 - 18.0 g/dL Final  . HCT 03/16/2015 48.3  40.0 - 52.0 % Final  . MCV 03/16/2015 85.1  80.0 - 100.0 fL Final  . MCH 03/16/2015 29.2  26.0 - 34.0 pg Final  . MCHC 03/16/2015 34.3  32.0 - 36.0 g/dL Final  . RDW 03/16/2015 14.2  11.5 - 14.5 % Final  . Platelets 03/16/2015 213  150 - 440 K/uL Final  . Neutrophils Relative % 03/16/2015 74   Final  . Neutro Abs 03/16/2015 4.3  1.4 - 6.5 K/uL Final  . Lymphocytes Relative 03/16/2015 13   Final  . Lymphs Abs 03/16/2015 0.8* 1.0 - 3.6 K/uL Final  . Monocytes Relative 03/16/2015 11    Final  . Monocytes Absolute 03/16/2015 0.7  0.2 - 1.0 K/uL Final  . Eosinophils Relative 03/16/2015 1   Final  . Eosinophils Absolute 03/16/2015 0.1  0 - 0.7 K/uL Final  . Basophils Relative 03/16/2015 1   Final  . Basophils Absolute 03/16/2015 0.1  0 - 0.1 K/uL Final  . Sodium 03/16/2015 133* 135 - 145 mmol/L Final  . Potassium 03/16/2015 3.9  3.5 - 5.1 mmol/L Final  . Chloride 03/16/2015 100* 101 - 111 mmol/L Final  . CO2 03/16/2015 26  22 - 32 mmol/L Final  . Glucose, Bld 03/16/2015 112* 65 - 99 mg/dL Final  . BUN 03/16/2015 24* 6 - 20 mg/dL Final  . Creatinine, Ser 03/16/2015 0.90  0.61 - 1.24 mg/dL Final  . Calcium 03/16/2015 9.0  8.9 - 10.3 mg/dL Final  . Total Protein 03/16/2015 7.1  6.5 - 8.1 g/dL Final  . Albumin 03/16/2015 4.4  3.5 - 5.0 g/dL Final  . AST 03/16/2015 20  15 - 41 U/L Final  . ALT 03/16/2015 23  17 - 63 U/L Final  . Alkaline Phosphatase 03/16/2015 66  38 - 126 U/L Final  . Total Bilirubin 03/16/2015 0.8  0.3 - 1.2 mg/dL Final  .  GFR calc non Af Amer 03/16/2015 >60  >60 mL/min Final  . GFR calc Af Amer 03/16/2015 >60  >60 mL/min Final   Comment: (NOTE) The eGFR has been calculated using the CKD EPI equation. This calculation has not been validated in all clinical situations. eGFR's persistently <60 mL/min signify possible Chronic Kidney Disease.   . Anion gap 03/16/2015 7  5 - 15 Final    STUDIES: No results found.  ASSESSMENT: Rectal cancer, stage II, T3 N0 M0   PLAN:   1. Rectal cancer. Patient initially diagnosed in May 2010, was treated with Xeloda and radiation treatment concurrently as patient did not want to have Port-A-Cath inserted at that time for 5-FU pump. Patient then had abdominoperineal resection in August 2010, followed by FOLFOX that was completed in April 2011. Clinically there are no signs of recurrent disease. CEA is stable at 4.7. She had a colonoscopy in November 2016, which revealed a small tubular adenoma. He will continue a  follow-up with GI specialists, likely will need a colonoscopy in 5 years. Since he is more than 5 years out from the completion of treatment, we will switch to annual visits and will remove a Port-A-Cath.  Patient expressed understanding and was in agreement with this plan. He also understands that He can call clinic at any time with any questions, concerns, or complaints.     Roxana Hires, MD   03/16/2015 9:46 AM

## 2015-03-17 ENCOUNTER — Telehealth: Payer: Self-pay | Admitting: *Deleted

## 2015-03-17 LAB — CEA: CEA: 4.9 ng/mL — ABNORMAL HIGH (ref 0.0–4.7)

## 2015-03-17 NOTE — Telephone Encounter (Signed)
Called pt to let him know that I called and got appt to have port removed with Dr. Tamala Julian at [facility] for 2/24 10 am.  I gave date and time to pt and put letter in the mail stating date and time of appt and location of Dr. Tamala Julian office even though pt states he remembers where office is and it would be great to have letter mailed to him.

## 2016-03-10 ENCOUNTER — Other Ambulatory Visit: Payer: Self-pay | Admitting: Internal Medicine

## 2016-03-10 DIAGNOSIS — R1084 Generalized abdominal pain: Secondary | ICD-10-CM

## 2016-03-15 ENCOUNTER — Other Ambulatory Visit: Payer: Self-pay | Admitting: *Deleted

## 2016-03-15 DIAGNOSIS — C20 Malignant neoplasm of rectum: Secondary | ICD-10-CM

## 2016-03-16 ENCOUNTER — Inpatient Hospital Stay: Payer: Medicare Other | Attending: Internal Medicine | Admitting: Internal Medicine

## 2016-03-16 ENCOUNTER — Inpatient Hospital Stay: Payer: Medicare Other

## 2016-03-16 VITALS — BP 133/79 | HR 99 | Resp 20 | Ht 69.0 in | Wt 189.8 lb

## 2016-03-16 DIAGNOSIS — Z9221 Personal history of antineoplastic chemotherapy: Secondary | ICD-10-CM | POA: Insufficient documentation

## 2016-03-16 DIAGNOSIS — Z79899 Other long term (current) drug therapy: Secondary | ICD-10-CM | POA: Insufficient documentation

## 2016-03-16 DIAGNOSIS — K219 Gastro-esophageal reflux disease without esophagitis: Secondary | ICD-10-CM | POA: Insufficient documentation

## 2016-03-16 DIAGNOSIS — I2510 Atherosclerotic heart disease of native coronary artery without angina pectoris: Secondary | ICD-10-CM | POA: Diagnosis not present

## 2016-03-16 DIAGNOSIS — C20 Malignant neoplasm of rectum: Secondary | ICD-10-CM

## 2016-03-16 DIAGNOSIS — G629 Polyneuropathy, unspecified: Secondary | ICD-10-CM | POA: Diagnosis not present

## 2016-03-16 DIAGNOSIS — M5030 Other cervical disc degeneration, unspecified cervical region: Secondary | ICD-10-CM | POA: Diagnosis not present

## 2016-03-16 DIAGNOSIS — K449 Diaphragmatic hernia without obstruction or gangrene: Secondary | ICD-10-CM | POA: Diagnosis not present

## 2016-03-16 DIAGNOSIS — Z85040 Personal history of malignant carcinoid tumor of rectum: Secondary | ICD-10-CM

## 2016-03-16 DIAGNOSIS — I10 Essential (primary) hypertension: Secondary | ICD-10-CM | POA: Insufficient documentation

## 2016-03-16 DIAGNOSIS — Z923 Personal history of irradiation: Secondary | ICD-10-CM | POA: Diagnosis not present

## 2016-03-16 DIAGNOSIS — R42 Dizziness and giddiness: Secondary | ICD-10-CM | POA: Insufficient documentation

## 2016-03-16 LAB — COMPREHENSIVE METABOLIC PANEL
ALBUMIN: 4.2 g/dL (ref 3.5–5.0)
ALT: 22 U/L (ref 17–63)
ANION GAP: 6 (ref 5–15)
AST: 25 U/L (ref 15–41)
Alkaline Phosphatase: 49 U/L (ref 38–126)
BUN: 22 mg/dL — ABNORMAL HIGH (ref 6–20)
CHLORIDE: 102 mmol/L (ref 101–111)
CO2: 29 mmol/L (ref 22–32)
Calcium: 9.1 mg/dL (ref 8.9–10.3)
Creatinine, Ser: 1 mg/dL (ref 0.61–1.24)
GFR calc Af Amer: >60 mL/min (ref >60)
GFR calc non Af Amer: >60 mL/min (ref >60)
GLUCOSE: 98 mg/dL (ref 65–99)
POTASSIUM: 3.6 mmol/L (ref 3.5–5.1)
SODIUM: 137 mmol/L (ref 135–145)
Total Bilirubin: 0.6 mg/dL (ref 0.3–1.2)
Total Protein: 7.3 g/dL (ref 6.5–8.1)

## 2016-03-16 LAB — CBC WITH DIFFERENTIAL/PLATELET
BASOS ABS: 0 10*3/uL (ref 0–0.1)
BASOS PCT: 1 %
EOS ABS: 0.1 10*3/uL (ref 0–0.7)
Eosinophils Relative: 1 %
HCT: 47.5 % (ref 40.0–52.0)
Hemoglobin: 16.3 g/dL (ref 13.0–18.0)
Lymphocytes Relative: 13 %
Lymphs Abs: 0.7 10*3/uL — ABNORMAL LOW (ref 1.0–3.6)
MCH: 29.1 pg (ref 26.0–34.0)
MCHC: 34.3 g/dL (ref 32.0–36.0)
MCV: 84.7 fL (ref 80.0–100.0)
MONO ABS: 0.5 10*3/uL (ref 0.2–1.0)
MONOS PCT: 9 %
NEUTROS PCT: 76 %
Neutro Abs: 4.2 10*3/uL (ref 1.4–6.5)
Platelets: 227 10*3/uL (ref 150–440)
RBC: 5.61 MIL/uL (ref 4.40–5.90)
RDW: 14.3 % (ref 11.5–14.5)
WBC: 5.6 10*3/uL (ref 3.8–10.6)

## 2016-03-16 NOTE — Assessment & Plan Note (Addendum)
#   Hx of rectal cancer/  abdominal discomfort- - ? Recurrence vs sec to hernia. Await CT scan planed tomorrow. colonoscopy- Nov 2016 [Dr.Skulskie]; recommend follow up with GI re: repeat colonoscopy.   # Dizzy spells- chronic reluctant to try meclizine.    # Peripheral Neuropathy- G-2;-3 wants to wait to start neurontin.   # follow up in 4 weeks/ no labs. For await the results of the CT scan.  # 25 minutes face-to-face with the patient discussing the above plan of care; more than 50% of time spent on prognosis/ natural history; counseling and coordination.

## 2016-03-16 NOTE — Progress Notes (Signed)
[place] OFFICE PROGRESS NOTE  Patient Care Team: Katheren Shams as PCP - General  Cancer Staging Rectal cancer [facility]) Staging form: Colon and Rectum, AJCC 7th Edition - Clinical stage from 06/15/2009: Stage IIA (T3, N0, M0) - Signed by Evlyn Kanner, NP on 09/16/2014    Oncology History   # 2010- rectal cancer T3 N0 M0, clinically stage II. At that time he also had a small but nondiagnostic pelvic node, negative by PET scan. Status post treatment with concurrent chemotherapy and radiation. Patient completed short course of Xeloda with radiation, then abdominoperineal resection in 2010 with FOLFOX starting in April 2011 [Dr.Smith; Dr.Gittin]  #      Rectal cancer [facility])   09/11/2014 Initial Diagnosis    Rectal cancer        This is my first interaction with the patient as patient's primary oncologist has been Drs.Gittin/.Berenzon. I reviewed the patient's prior charts/pertinent labs/imaging in detail; findings are summarized above.     INTERVAL HISTORY:  Jeffrey Roberson 66 y.o.  male pleasant patient above history of Rectal cancer stage II status post chemoradiation followed by APR followed by adjuvant chemotherapy is here for follow-up.  Patient complains of worsening discomfort in the left lower quadrant; increasing swelling at the ostomy site. He has intermittent blood in the ostomy. No significant bleeding. His weight is stable. Denies any chest pain or shortness of breath or cough. Denies any pain. Denies any nausea vomiting. Patient also complains of significant tingling and numbness of his extremities. He is not on any gabapentin. Patient is awaiting to get a CT scan of the abdomen and pelvis with his PCP tomorrow. Patient also has chronic intermittent dizzy spells. Going on for many years.  REVIEW OF SYSTEMS:  A complete 10 point review of system is done which is negative except mentioned above/history of present illness.   PAST MEDICAL HISTORY :   Past Medical History:  Diagnosis Date  . Cancer [facility])    Rectal   . Coronary artery disease   . DDD (degenerative disc disease), cervical   . Diverticulosis   . Fundic gland polyps of stomach, benign   . GERD (gastroesophageal reflux disease)   . Hemorrhage of rectum and anus   . History of hiatal hernia   . Hypertension   . Pneumothorax, spontaneous, tension   . Rectal cancer ([place]) 09/11/2014    PAST SURGICAL HISTORY :   Past Surgical History:  Procedure Laterality Date  . ABDOMINOPERINEAL PROCTOCOLECTOMY  10/18/11  . COLON SURGERY    . COLONOSCOPY N/A 12/23/2014   Procedure: COLONOSCOPY;  Surgeon: Lollie Sails, MD;  Location: [facility] ENDOSCOPY;  Service: Endoscopy;  Laterality: N/A;    FAMILY HISTORY :   Family History  Problem Relation Age of Onset  . Colon cancer Father     SOCIAL HISTORY:   Social History  Substance Use Topics  . Smoking status: Never Smoker  . Smokeless tobacco: Never Used  . Alcohol use No    ALLERGIES:  is allergic to penicillin v potassium.  MEDICATIONS:  Current Outpatient Prescriptions  Medication Sig Dispense Refill  . lisinopril-hydrochlorothiazide (PRINZIDE,ZESTORETIC) 10-12.5 MG per tablet TAKE 1 TABLET DAILY    . omeprazole (PRILOSEC) 20 MG capsule Take by mouth.     No current facility-administered medications for this visit.     PHYSICAL EXAMINATION: ECOG PERFORMANCE STATUS: 0 - Asymptomatic  BP 133/79 (BP Location: Left Arm, Patient Position: Sitting)   Pulse 99   Resp  20   Ht 5\' 9"  (1.753 m)   Wt 189 lb 13.1 oz (86.1 kg)   BMI 28.03 kg/m   Filed Weights   03/16/16 1054  Weight: 189 lb 13.1 oz (86.1 kg)    GENERAL: Well-nourished well-developed; Alert, no distress and comfortable.   Alone.  EYES: no pallor or icterus OROPHARYNX: no thrush or ulceration; good dentition  NECK: supple, no masses felt LYMPH:  no palpable lymphadenopathy in the cervical, axillary or inguinal regions LUNGS: clear to auscultation and   No wheeze or crackles HEART/CVS: regular rate & rhythm and no murmurs; No lower extremity edema ABDOMEN:abdomen soft, non-tender and normal bowel sounds; Large hernia noted around the ostomy. No blood in the colostomy.  Musculoskeletal:no cyanosis of digits and no clubbing  PSYCH: alert & oriented x 3 with fluent speech NEURO: no focal motor/sensory deficits SKIN:  no rashes or significant lesions  LABORATORY DATA:  I have reviewed the data as listed    Component Value Date/Time   NA 137 03/16/2016 1000   K 3.6 03/16/2016 1000   CL 102 03/16/2016 1000   CO2 29 03/16/2016 1000   GLUCOSE 98 03/16/2016 1000   BUN 22 (H) 03/16/2016 1000   CREATININE 1.00 03/16/2016 1000   CREATININE 1.01 03/15/2012 0950   CALCIUM 9.1 03/16/2016 1000   PROT 7.3 03/16/2016 1000   PROT 7.2 11/10/2011 1010   ALBUMIN 4.2 03/16/2016 1000   ALBUMIN 3.8 11/10/2011 1010   AST 25 03/16/2016 1000   AST 17 11/10/2011 1010   ALT 22 03/16/2016 1000   ALT 22 11/10/2011 1010   ALKPHOS 49 03/16/2016 1000   ALKPHOS 69 11/10/2011 1010   BILITOT 0.6 03/16/2016 1000   BILITOT 0.5 11/10/2011 1010   GFRNONAA >60 03/16/2016 1000   GFRNONAA >60 03/15/2012 0950   GFRAA >60 03/16/2016 1000   GFRAA >60 03/15/2012 0950    No results found for: SPEP, UPEP  Lab Results  Component Value Date   WBC 5.6 03/16/2016   NEUTROABS 4.2 03/16/2016   HGB 16.3 03/16/2016   HCT 47.5 03/16/2016   MCV 84.7 03/16/2016   PLT 227 03/16/2016      Chemistry      Component Value Date/Time   NA 137 03/16/2016 1000   K 3.6 03/16/2016 1000   CL 102 03/16/2016 1000   CO2 29 03/16/2016 1000   BUN 22 (H) 03/16/2016 1000   CREATININE 1.00 03/16/2016 1000   CREATININE 1.01 03/15/2012 0950      Component Value Date/Time   CALCIUM 9.1 03/16/2016 1000   ALKPHOS 49 03/16/2016 1000   ALKPHOS 69 11/10/2011 1010   AST 25 03/16/2016 1000   AST 17 11/10/2011 1010   ALT 22 03/16/2016 1000   ALT 22 11/10/2011 1010   BILITOT 0.6  03/16/2016 1000   BILITOT 0.5 11/10/2011 1010       RADIOGRAPHIC STUDIES: I have personally reviewed the radiological images as listed and agreed with the findings in the report. No results found.   ASSESSMENT & PLAN:  Rectal cancer # Hx of rectal cancer/  abdominal discomfort- - ? Recurrence vs sec to hernia. Await CT scan planed tomorrow. colonoscopy- Nov 2016 [Dr.Skulskie]; recommend follow up with GI re: repeat colonoscopy.   # Dizzy spells- chronic reluctant to try meclizine.    # Peripheral Neuropathy- G-2;-3 wants to wait to start neurontin.   # follow up in 4 weeks/ no labs. For await the results of the CT scan.  # 25  minutes face-to-face with the patient discussing the above plan of care; more than 50% of time spent on prognosis/ natural history; counseling and coordination.   No orders of the defined types were placed in this encounter.  All questions were answered. The patient knows to call the clinic with any problems, questions or concerns.      Cammie Sickle, MD 03/16/2016 1:32 PM

## 2016-03-16 NOTE — Progress Notes (Signed)
Pt has a personal history of rectal cancer. He states that he was treatment previously with FOLFOX. He remembers having a reaction in infusion to one of his chemotherapy treatments previously 4 years ago, but does not recall which chemotherapy drug he reacted to at that time.  He states that he was also treatment with Xeloda in past. He has a functioning ostomy. He denies any diarrhea or episodes of constipation, denies nausea/vomiting. He is passing flatus via ostomy bag. Pt states that he has intermittent rectal bleeding.  Denies chest pain.  Patient reports abdominal distention and abdominal discomfort when standing up. This "pressure causes intermittent shortness of breath." 7 lb weight loss since last visit in cancer center. - intentional.  Patient reports "numbness and tingling in tips of all of fingers" in  bilaterally hands. Reports numbness and tingling in toes - affecting gait- can not feel floor while walking. Has stumbled and falling in past year r/t numbness in feet. Has not fallen in last 6 months. Pt walked in cancer center today with a normal gait.  Patient scheduled for ct scan tomorrow of abdomen/pelvis to further evaluate his abdominal pain. This scan was ordered by his pcp-Dr. Doy Hutching.  Last colonoscopy - in 2016- by Dr. Gwenlyn Perking. Pt has not been reevaluated by GI provider since this last apt.

## 2016-03-17 ENCOUNTER — Ambulatory Visit
Admission: RE | Admit: 2016-03-17 | Discharge: 2016-03-17 | Disposition: A | Discharge status: Home / Self Care | Payer: Medicare Other | Source: Ambulatory Visit | Attending: Internal Medicine | Admitting: Internal Medicine

## 2016-03-17 DIAGNOSIS — R938 Abnormal findings on diagnostic imaging of other specified body structures: Secondary | ICD-10-CM | POA: Diagnosis not present

## 2016-03-17 DIAGNOSIS — R1084 Generalized abdominal pain: Secondary | ICD-10-CM

## 2016-03-17 DIAGNOSIS — K435 Parastomal hernia without obstruction or  gangrene: Secondary | ICD-10-CM | POA: Insufficient documentation

## 2016-03-17 DIAGNOSIS — K449 Diaphragmatic hernia without obstruction or gangrene: Secondary | ICD-10-CM | POA: Insufficient documentation

## 2016-03-17 LAB — CEA: CEA: 4.2 ng/mL (ref 0.0–4.7)

## 2016-03-17 MED ORDER — IOPAMIDOL (ISOVUE-300) INJECTION 61%
100.0000 mL | Freq: Once | INTRAVENOUS | Status: AC | PRN
  Administered 2016-03-17: 100 mL via INTRAVENOUS

## 2016-04-13 ENCOUNTER — Ambulatory Visit: Payer: Medicare Other | Admitting: Internal Medicine

## 2016-04-28 ENCOUNTER — Inpatient Hospital Stay: Payer: Medicare Other | Attending: Internal Medicine | Admitting: Internal Medicine

## 2016-04-28 VITALS — BP 127/81 | HR 93 | Temp 97.7°F | Resp 18 | Wt 193.1 lb

## 2016-04-28 DIAGNOSIS — Z9221 Personal history of antineoplastic chemotherapy: Secondary | ICD-10-CM

## 2016-04-28 DIAGNOSIS — Z85048 Personal history of other malignant neoplasm of rectum, rectosigmoid junction, and anus: Secondary | ICD-10-CM | POA: Diagnosis not present

## 2016-04-28 DIAGNOSIS — G629 Polyneuropathy, unspecified: Secondary | ICD-10-CM

## 2016-04-28 DIAGNOSIS — K219 Gastro-esophageal reflux disease without esophagitis: Secondary | ICD-10-CM | POA: Diagnosis not present

## 2016-04-28 DIAGNOSIS — Z923 Personal history of irradiation: Secondary | ICD-10-CM | POA: Diagnosis not present

## 2016-04-28 DIAGNOSIS — I10 Essential (primary) hypertension: Secondary | ICD-10-CM | POA: Diagnosis not present

## 2016-04-28 DIAGNOSIS — C20 Malignant neoplasm of rectum: Secondary | ICD-10-CM

## 2016-04-28 DIAGNOSIS — Z79899 Other long term (current) drug therapy: Secondary | ICD-10-CM | POA: Insufficient documentation

## 2016-04-28 DIAGNOSIS — I2510 Atherosclerotic heart disease of native coronary artery without angina pectoris: Secondary | ICD-10-CM | POA: Diagnosis not present

## 2016-04-28 DIAGNOSIS — M5030 Other cervical disc degeneration, unspecified cervical region: Secondary | ICD-10-CM | POA: Diagnosis not present

## 2016-04-28 NOTE — Assessment & Plan Note (Addendum)
#   Hx of rectal cancer/  abdominal discomfort- CT scan shows no evidence of recurrence. 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 Patient reluctant to have it operated. No evidence of any obstruction.  # Peripheral Neuropathy- G-2; stable. Reluctant to try any medications.  # follow up in 6 months/labs/ cea.

## 2016-04-28 NOTE — Progress Notes (Signed)
Patient here today for follow up.  Patient states no new concerns today  

## 2016-04-28 NOTE — Progress Notes (Signed)
[place] OFFICE PROGRESS NOTE  Patient Care Team: Idelle Crouch, MD as PCP - General (Internal Medicine)  Cancer Staging Rectal cancer [facility]) Staging form: Colon and Rectum, AJCC 7th Edition - Clinical stage from 06/15/2009: Stage IIA (T3, N0, M0) - Signed by Evlyn Kanner, NP on 09/16/2014    Oncology History   # 2010- rectal cancer T3 N0 M0, clinically stage II. At that time he also had a small but nondiagnostic pelvic node, negative by PET scan. Status post treatment with concurrent chemotherapy and radiation. Patient completed short course of Xeloda with radiation, then abdominoperineal resection in 2010 with FOLFOX starting in April 2011 [Dr.Smith; Dr.Gittin]  # FEB 2018- CT A/P- chronic presacral changes; parastomal hernia.      Rectal cancer ([place])   09/11/2014 Initial Diagnosis    Rectal cancer        INTERVAL HISTORY:  Jeffrey Roberson 66 y.o.  male pleasant patient above history of Rectal cancer stage II status post chemoradiation followed by APR followed by adjuvant chemotherapy is here for follow-up/ review the results of the CT scan.   Patient's appetite is good. Continues to chronic abdominal discomfort. Not any worse. No constipation. No blood in his ostomy bag. Chronic tingling and numbness.    REVIEW OF SYSTEMS:  A complete 10 point review of system is done which is negative except mentioned above/history of present illness.   PAST MEDICAL HISTORY :  Past Medical History:  Diagnosis Date  . Cancer [facility])    Rectal   . Coronary artery disease   . DDD (degenerative disc disease), cervical   . Diverticulosis   . Fundic gland polyps of stomach, benign   . GERD (gastroesophageal reflux disease)   . Hemorrhage of rectum and anus   . History of hiatal hernia   . Hypertension   . Pneumothorax, spontaneous, tension   . Rectal cancer ([place]) 09/11/2014   colon    PAST SURGICAL HISTORY :   Past Surgical History:  Procedure Laterality Date   . ABDOMINOPERINEAL PROCTOCOLECTOMY  10/18/11  . COLON SURGERY    . COLONOSCOPY N/A 12/23/2014   Procedure: COLONOSCOPY;  Surgeon: Lollie Sails, MD;  Location: [facility] ENDOSCOPY;  Service: Endoscopy;  Laterality: N/A;    FAMILY HISTORY :   Family History  Problem Relation Age of Onset  . Colon cancer Father     SOCIAL HISTORY:   Social History  Substance Use Topics  . Smoking status: Never Smoker  . Smokeless tobacco: Never Used  . Alcohol use No    ALLERGIES:  is allergic to penicillin v potassium.  MEDICATIONS:  Current Outpatient Prescriptions  Medication Sig Dispense Refill  . lisinopril-hydrochlorothiazide (PRINZIDE,ZESTORETIC) 10-12.5 MG per tablet TAKE 1 TABLET DAILY    . omeprazole (PRILOSEC) 20 MG capsule Take by mouth.     No current facility-administered medications for this visit.     PHYSICAL EXAMINATION: ECOG PERFORMANCE STATUS: 0 - Asymptomatic  BP 127/81 (BP Location: Left Arm, Patient Position: Sitting)   Pulse 93   Temp 97.7 F (36.5 C) (Tympanic)   Resp 18   Wt 193 lb 2 oz (87.6 kg)   SpO2 95%   BMI 28.52 kg/m   Filed Weights   04/28/16 1122  Weight: 193 lb 2 oz (87.6 kg)    GENERAL: Well-nourished well-developed; Alert, no distress and comfortable.   Alone.  EYES: no pallor or icterus OROPHARYNX: no thrush or ulceration; good dentition  NECK: supple, no masses  felt LYMPH:  no palpable lymphadenopathy in the cervical, axillary or inguinal regions LUNGS: clear to auscultation and  No wheeze or crackles HEART/CVS: regular rate & rhythm and no murmurs; No lower extremity edema ABDOMEN:abdomen soft, non-tender and normal bowel sounds; Large hernia noted around the ostomy. No blood in the colostomy.  Musculoskeletal:no cyanosis of digits and no clubbing  PSYCH: alert & oriented x 3 with fluent speech NEURO: no focal motor/sensory deficits SKIN:  no rashes or significant lesions  LABORATORY DATA:  I have reviewed the data as listed     Component Value Date/Time   NA 137 03/16/2016 1000   K 3.6 03/16/2016 1000   CL 102 03/16/2016 1000   CO2 29 03/16/2016 1000   GLUCOSE 98 03/16/2016 1000   BUN 22 (H) 03/16/2016 1000   CREATININE 1.00 03/16/2016 1000   CREATININE 1.01 03/15/2012 0950   CALCIUM 9.1 03/16/2016 1000   PROT 7.3 03/16/2016 1000   PROT 7.2 11/10/2011 1010   ALBUMIN 4.2 03/16/2016 1000   ALBUMIN 3.8 11/10/2011 1010   AST 25 03/16/2016 1000   AST 17 11/10/2011 1010   ALT 22 03/16/2016 1000   ALT 22 11/10/2011 1010   ALKPHOS 49 03/16/2016 1000   ALKPHOS 69 11/10/2011 1010   BILITOT 0.6 03/16/2016 1000   BILITOT 0.5 11/10/2011 1010   GFRNONAA >60 03/16/2016 1000   GFRNONAA >60 03/15/2012 0950   GFRAA >60 03/16/2016 1000   GFRAA >60 03/15/2012 0950    No results found for: SPEP, UPEP  Lab Results  Component Value Date   WBC 5.6 03/16/2016   NEUTROABS 4.2 03/16/2016   HGB 16.3 03/16/2016   HCT 47.5 03/16/2016   MCV 84.7 03/16/2016   PLT 227 03/16/2016      Chemistry      Component Value Date/Time   NA 137 03/16/2016 1000   K 3.6 03/16/2016 1000   CL 102 03/16/2016 1000   CO2 29 03/16/2016 1000   BUN 22 (H) 03/16/2016 1000   CREATININE 1.00 03/16/2016 1000   CREATININE 1.01 03/15/2012 0950      Component Value Date/Time   CALCIUM 9.1 03/16/2016 1000   ALKPHOS 49 03/16/2016 1000   ALKPHOS 69 11/10/2011 1010   AST 25 03/16/2016 1000   AST 17 11/10/2011 1010   ALT 22 03/16/2016 1000   ALT 22 11/10/2011 1010   BILITOT 0.6 03/16/2016 1000   BILITOT 0.5 11/10/2011 1010       RADIOGRAPHIC STUDIES: I have personally reviewed the radiological images as listed and agreed with the findings in the report. No results found.   ASSESSMENT & PLAN:  Rectal cancer # Hx of rectal cancer/  abdominal discomfort- CT scan shows no evidence of recurrence. 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 Patient reluctant to have it operated. No evidence of any obstruction.  # Peripheral Neuropathy- G-2;  stable. Reluctant to try any medications.  # follow up in 6 months/labs/ cea.    Orders Placed This Encounter  Procedures  . CEA    Standing Status:   Future    Standing Expiration Date:   01/28/2017  . CBC with Differential/Platelet    Standing Status:   Future    Standing Expiration Date:   01/28/2017  . Comprehensive metabolic panel    Standing Status:   Future    Standing Expiration Date:   01/28/2017   All questions were answered. The patient knows to call the clinic with any problems, questions or concerns.  Cammie Sickle, MD 04/28/2016 12:12 PM

## 2016-09-13 ENCOUNTER — Encounter: Payer: Self-pay | Admitting: Emergency Medicine

## 2016-09-13 ENCOUNTER — Emergency Department: Payer: Medicare Other

## 2016-09-13 ENCOUNTER — Emergency Department
Admission: EM | Admit: 2016-09-13 | Discharge: 2016-09-13 | Disposition: A | Discharge status: Home / Self Care | Payer: Medicare Other | Attending: Emergency Medicine | Admitting: Emergency Medicine

## 2016-09-13 DIAGNOSIS — R55 Syncope and collapse: Secondary | ICD-10-CM | POA: Diagnosis not present

## 2016-09-13 DIAGNOSIS — K91872 Postprocedural seroma of a digestive system organ or structure following a digestive system procedure: Secondary | ICD-10-CM | POA: Insufficient documentation

## 2016-09-13 DIAGNOSIS — Z79899 Other long term (current) drug therapy: Secondary | ICD-10-CM | POA: Insufficient documentation

## 2016-09-13 DIAGNOSIS — K625 Hemorrhage of anus and rectum: Secondary | ICD-10-CM | POA: Diagnosis present

## 2016-09-13 DIAGNOSIS — I2510 Atherosclerotic heart disease of native coronary artery without angina pectoris: Secondary | ICD-10-CM | POA: Diagnosis not present

## 2016-09-13 DIAGNOSIS — I10 Essential (primary) hypertension: Secondary | ICD-10-CM | POA: Insufficient documentation

## 2016-09-13 LAB — CBC
HCT: 45.4 % (ref 40.0–52.0)
Hemoglobin: 15.5 g/dL (ref 13.0–18.0)
MCH: 29.2 pg (ref 26.0–34.0)
MCHC: 34.2 g/dL (ref 32.0–36.0)
MCV: 85.3 fL (ref 80.0–100.0)
Platelets: 194 10*3/uL (ref 150–440)
RBC: 5.32 MIL/uL (ref 4.40–5.90)
RDW: 13.9 % (ref 11.5–14.5)
WBC: 11.5 10*3/uL — ABNORMAL HIGH (ref 3.8–10.6)

## 2016-09-13 LAB — TYPE AND SCREEN
ABO/RH(D): A POS
ANTIBODY SCREEN: NEGATIVE

## 2016-09-13 LAB — COMPREHENSIVE METABOLIC PANEL
ALK PHOS: 45 U/L (ref 38–126)
ALT: 14 U/L — AB (ref 17–63)
AST: 22 U/L (ref 15–41)
Albumin: 3.7 g/dL (ref 3.5–5.0)
Anion gap: 11 (ref 5–15)
BUN: 15 mg/dL (ref 6–20)
CALCIUM: 9 mg/dL (ref 8.9–10.3)
CHLORIDE: 102 mmol/L (ref 101–111)
CO2: 24 mmol/L (ref 22–32)
CREATININE: 0.92 mg/dL (ref 0.61–1.24)
GFR calc non Af Amer: >60 mL/min (ref >60)
Glucose, Bld: 132 mg/dL — ABNORMAL HIGH (ref 65–99)
Potassium: 3.3 mmol/L — ABNORMAL LOW (ref 3.5–5.1)
Sodium: 137 mmol/L (ref 135–145)
TOTAL PROTEIN: 6.8 g/dL (ref 6.5–8.1)
Total Bilirubin: 0.9 mg/dL (ref 0.3–1.2)

## 2016-09-13 LAB — APTT: APTT: 33 s (ref 24–36)

## 2016-09-13 LAB — PROTIME-INR
INR: 1.13
PROTHROMBIN TIME: 14.6 s (ref 11.4–15.2)

## 2016-09-13 LAB — TROPONIN I: Troponin I: <0.03 ng/mL (ref <0.03)

## 2016-09-13 MED ORDER — IOPAMIDOL (ISOVUE-300) INJECTION 61%
100.0000 mL | Freq: Once | INTRAVENOUS | Status: AC | PRN
  Administered 2016-09-13: 100 mL via INTRAVENOUS

## 2016-09-13 MED ORDER — IOPAMIDOL (ISOVUE-300) INJECTION 61%
30.0000 mL | Freq: Once | INTRAVENOUS | Status: AC | PRN
  Administered 2016-09-13: 30 mL via ORAL

## 2016-09-13 NOTE — ED Provider Notes (Signed)
[facility] Emergency Department Provider Note   ____________________________________________    I have reviewed the triage vital signs and the nursing notes.   HISTORY  Chief Complaint Rectal Bleeding and Near Syncope     HPI Jeffrey Roberson is a 66 y.o. male who presents with complaints of rectal bleeding. Patient reports 8 years ago he had rectal cancer removed by Dr. Rochel Brome and had a colostomy. He reports yesterday evening he had clear fluid drainage from where his anus used to be followed by blood. He also reports pain around his prior anal area. Denies fevers or chills. Does report some mild nausea, no abdominal pain. Normal stool from colostomy. No fevers or chills   Past Medical History:  Diagnosis Date  . Cancer [facility])    Rectal   . Coronary artery disease   . DDD (degenerative disc disease), cervical   . Diverticulosis   . Fundic gland polyps of stomach, benign   . GERD (gastroesophageal reflux disease)   . Hemorrhage of rectum and anus   . History of hiatal hernia   . Hypertension   . Pneumothorax, spontaneous, tension   . 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 Acid reflux 09/12/2014  . Spontaneous pneumothorax 09/12/2014  . 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 Rectal cancer ([place]) 09/11/2014    Past Surgical History:  Procedure Laterality Date  . ABDOMINOPERINEAL PROCTOCOLECTOMY  10/18/11  . COLON SURGERY    . COLONOSCOPY N/A 12/23/2014   Procedure: COLONOSCOPY;  Surgeon: Lollie Sails, MD;  Location: [facility] ENDOSCOPY;  Service: Endoscopy;  Laterality: N/A;  . Colostomy Placement      Prior to Admission medications   Medication Sig Start Date End Date Taking? Authorizing Provider  lisinopril-hydrochlorothiazide (PRINZIDE,ZESTORETIC) 10-12.5 MG per tablet TAKE 1 TABLET DAILY 12/25/13  Yes [provider]  omeprazole (PRILOSEC) 20 MG capsule Take by  mouth.   Yes [provider]     Allergies Other and Penicillin v potassium  Family History  Problem Relation Age of Onset  . Colon cancer Father     Social History Social History  Substance Use Topics  . Smoking status: Never Smoker  . Smokeless tobacco: Never Used  . Alcohol use No    Review of Systems  Constitutional: No fever/chills Eyes: No visual changes.  ENT: No sore throat. Cardiovascular: Denies chest pain. Respiratory: Denies shortness of breath. Gastrointestinal: No abdominal pain.  As above Genitourinary: Negative for dysuria. Musculoskeletal: Negative for back pain. Skin: Negative for rash. Neurological: Negative for headaches    ____________________________________________   PHYSICAL EXAM:  VITAL SIGNS: ED Triage Vitals [09/13/16 1047]  Enc Vitals Group     BP (!) 144/86     Pulse Rate (!) 106     Resp 18     Temp 98.3 F (36.8 C)     Temp Source Oral     SpO2 98 %     Weight 85.7 kg (189 lb)     Height 1.778 m (5\' 10" )     Head Circumference      Peak Flow      Pain Score      Pain Loc      Pain Edu?      Excl. in [place]?     Constitutional: Alert and oriented. No acute distress. Pleasant and interactive Eyes: Conjunctivae are normal.  Head: Atraumatic. Nose: No congestion/rhinnorhea. Mouth/Throat:  Mucous membranes are moist.   Neck:  Painless ROM Cardiovascular: Normal rate, regular rhythm. Grossly normal heart sounds.  Good peripheral circulation. Respiratory: Normal respiratory effort.  No retractions. Lungs CTAB. Gastrointestinal: Soft and nontender. No distention.  Ostomy well-appearing with brown stool in bag. Evaluation of anal area demonstrates tenderness but no significant redness. Small amount of clearish fluid draining Genitourinary: deferred Musculoskeletal: No lower extremity tenderness nor edema.  Warm and well perfused Neurologic:  Normal speech and language. No gross focal neurologic deficits are appreciated.    Skin:  Skin is warm, dry and intact. No rash noted. Psychiatric: Mood and affect are normal. Speech and behavior are normal.  ____________________________________________   LABS (all labs ordered are listed, but only abnormal results are displayed)  Labs Reviewed  CBC - Abnormal; Notable for the following:       Result Value   WBC 11.5 (*)    All other components within normal limits  COMPREHENSIVE METABOLIC PANEL - Abnormal; Notable for the following:    Potassium 3.3 (*)    Glucose, Bld 132 (*)    ALT 14 (*)    All other components within normal limits  TROPONIN I  APTT  PROTIME-INR  TYPE AND SCREEN   ____________________________________________  EKG  None ____________________________________________  RADIOLOGY  CT demonstrates small my fluid and rectum ____________________________________________   PROCEDURES  Procedure(s) performed: No    Critical Care performed: No ____________________________________________   INITIAL IMPRESSION / ASSESSMENT AND PLAN / ED COURSE  Pertinent labs & imaging results that were available during my care of the patient were reviewed by me and considered in my medical decision making (see chart for details).  Patient with clearish fluid and blood from prior anal area. Unclear cause of this. Possibilities include infection, hemorrhage. CT scan overall reassuring. Lab work significant for mildly elevated white blood cell count  Discussed with Dr. Rochel Brome of surgery who will review the CT and prior surgical records  Dr. Tamala Julian in to see patient  Dr. Tamala Julian suspect seroma and feels the patient is appropriate for discharge    ____________________________________________   FINAL CLINICAL IMPRESSION(S) / ED DIAGNOSES  Final diagnoses:  Seroma of digestive system after digestive system procedure      NEW MEDICATIONS STARTED DURING THIS VISIT:  New Prescriptions   No medications on file     Note:  This document  was prepared using Dragon voice recognition software and may include unintentional dictation errors.    Lavonia Drafts, MD 09/13/16 1447

## 2016-09-13 NOTE — ED Notes (Signed)
Pt visualized in NAD. Resting in bed with wife at bedside. Pt denies any needs. Will continue to monitor for further patient needs.

## 2016-09-13 NOTE — Discharge Instructions (Signed)
Please take your time when standing up. Consider urinating sitting down. Dr. Tamala Julian is considering repeating a PET scan, his office will contact you.

## 2016-09-13 NOTE — ED Notes (Signed)
NAD noted at time of D/C. Pt denies questions or concerns. Pt ambulatory to the lobby at this time. Pt refused wheelchair to the lobby.  

## 2016-09-13 NOTE — ED Notes (Signed)
Per Dr. Tamala Julian, orthostatics, give patient something to eat/drink, and ambulate patient to make sure he does not become syncopal again.

## 2016-09-13 NOTE — ED Triage Notes (Signed)
Pt presents to ED via POV from Dr. Doy Hutching' office. Pt states hx of colorectal cancer with colostomy bag placed to L abdomen x 8 yrs. Pt reports last night began having clear liquid draining from his rectum, pt reports over night large amounts of dark red blood leaking from his rectum. Pt reports that if he "strains at all, there is a lot of pressure". Pt's wife reports blood on shorts, and blood had leaked through onto the sheets. Pt also reports near-syncopal episode this morning when he went to the bathroom. Pt reports feeling nauseous, 1 episode of dry heaving, and then feeling like he was going to pass out.

## 2016-09-13 NOTE — ED Notes (Signed)
Dr. Tamala Julian notified of results of orthostatic vitals. Pt ambulatory with no difficulty. Pt given diet shasta cola and graham crackers per patient request with Dr. Tamala Julian permission.

## 2016-09-13 NOTE — ED Notes (Signed)
Dr. Smith at bedside at this time.

## 2016-09-13 NOTE — ED Notes (Signed)
Pt taken to CT.

## 2016-09-13 NOTE — ED Triage Notes (Signed)
First Nurse Note:  Arrives from Dr. Doy Hutching office for ED evaluation.  Patient has history of Rectal CA with colostomy.    Patient has c/o presyncope, tachycardia, and rectal bleeding today.

## 2016-09-13 NOTE — H&P (Signed)
Jeffrey Roberson is an 66 y.o. male.   Chief Complaint: Bloody discharge HPI: He has a history of rectal cancer and had abdominal perineal resection in 2010. Recently he was in his usual state of health until 4 days ago when he began to have some pelvic discomfort. He also reports that he started having some straw-colored fluid drained out of his perineum. This is continued and small amounts until this morning he arose from bed at 7 AM and did fill some nausea and went to empty his bladder. As he was nearing the end of urination he became dizzy and thought it was, passed out. Surgically lay down on his couch and felt better. He also reports that at the same time he developed some bloody discharge from the perineum. He said continued moderate discomfort at that site. He saw his primary care physician Dr. Doy Hutching in the office this morning and was referred to the emergency room for further evaluation. He was initially evaluated by the emergency room staff and did have a CT scan of the abdomen and pelvis which I have reviewed. The CT images demonstrate a small amount of fluid in the perineum at the site where his rectum had been per prior to surgery. The current collection of fluid appears to be small in size probably about 5-10 cc of fluid collection. By the time of surgical consultation he reports that he feels much better. He is no longer dizzy. His breathing satisfactorily. He reports minimal pelvic discomfort. He now feels hungry.  On further review of records he did have the abdominal perineal resection in 2010 he did have neoadjuvant chemotherapy and radiation and also had postoperative adjuvant chemotherapy. He last saw Dr. Rogue Bussing in March. His CEA at that time was 4.9. His tumor was recorded as T3 N0 M0 also reviewed that he last had PET scan in 2013 which showed no evidence of recurrence or metastasis. Plans were made for oncology follow-up in 6 months.  PAST MEDICAL HISTORY: He reports  minimal degree of coronary artery disease with a 30% blockage not needing any treatment. He reports no history of myocardial infarction and no past history of syncope.  He reports history of hypertension and on treatment.  He has known hiatus hernia and history of chronic gastroesophageal reflux and on treatment.  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  Other past medical history reviewed as noted below..    Past Medical History:  Diagnosis Date  . Cancer [facility])    Rectal   . Coronary artery disease   . DDD (degenerative disc disease), cervical   . Diverticulosis   . Fundic gland polyps of stomach, benign   . GERD (gastroesophageal reflux disease)   . Hemorrhage of rectum and anus   . History of hiatal hernia   . Hypertension   . Pneumothorax, spontaneous, tension   . Rectal cancer ([place]) 09/11/2014   colon    Past Surgical History:  Procedure Laterality Date  . ABDOMINOPERINEAL PROCTOCOLECTOMY  10/18/11  . COLON SURGERY    . COLONOSCOPY N/A 12/23/2014   Procedure: COLONOSCOPY;  Surgeon: Lollie Sails, MD;  Location: [facility] ENDOSCOPY;  Service: Endoscopy;  Laterality: N/A;  . Colostomy Placement      Family History  Problem Relation Age of Onset  . Colon cancer Father    Social History:  reports that he has never smoked. He has never used smokeless tobacco. He reports that he does not drink alcohol or  use drugs.  Allergies:  Allergies  Allergen Reactions  . Other     Pt reports allergy to previous cancer medication feeling a strong sensation of burning that radiated from legs to head.   Marland Kitchen Penicillin V Potassium Other (See Comments)    Edema  Has patient had a PCN reaction causing immediate rash, facial/tongue/throat swelling, SOB or lightheadedness with hypotension: Yes Has patient had a PCN reaction causing severe rash involving mucus membranes or skin necrosis: Unknown Has patient had a PCN reaction that required hospitalization:  Unknown Has patient had a PCN reaction occurring within the last 10 years: No If all of the above answers are "NO", then may proceed with Cephalosporin use.   Lab work was reviewed as noted below. White blood count was elevated at 11,500., Potassium 3.3. Troponin normal  (Not in a hospital admission)  Results for orders placed or performed during the hospital encounter of 09/13/16 (from the past 48 hour(s))  CBC     Status: Abnormal   Collection Time: 09/13/16 10:56 AM  Result Value Ref Range   WBC 11.5 (H) 3.8 - 10.6 K/uL   RBC 5.32 4.40 - 5.90 MIL/uL   Hemoglobin 15.5 13.0 - 18.0 g/dL   HCT 45.4 40.0 - 52.0 %   MCV 85.3 80.0 - 100.0 fL   MCH 29.2 26.0 - 34.0 pg   MCHC 34.2 32.0 - 36.0 g/dL   RDW 13.9 11.5 - 14.5 %   Platelets 194 150 - 440 K/uL  Comprehensive metabolic panel     Status: Abnormal   Collection Time: 09/13/16 10:56 AM  Result Value Ref Range   Sodium 137 135 - 145 mmol/L   Potassium 3.3 (L) 3.5 - 5.1 mmol/L   Chloride 102 101 - 111 mmol/L   CO2 24 22 - 32 mmol/L   Glucose, Bld 132 (H) 65 - 99 mg/dL   BUN 15 6 - 20 mg/dL   Creatinine, Ser 0.92 0.61 - 1.24 mg/dL   Calcium 9.0 8.9 - 10.3 mg/dL   Total Protein 6.8 6.5 - 8.1 g/dL   Albumin 3.7 3.5 - 5.0 g/dL   AST 22 15 - 41 U/L   ALT 14 (L) 17 - 63 U/L   Alkaline Phosphatase 45 38 - 126 U/L   Total Bilirubin 0.9 0.3 - 1.2 mg/dL   GFR calc non Af Amer >60 >60 mL/min   GFR calc Af Amer >60 >60 mL/min    Comment: (NOTE) The eGFR has been calculated using the CKD EPI equation. This calculation has not been validated in all clinical situations. eGFR's persistently <60 mL/min signify possible Chronic Kidney Disease.    Anion gap 11 5 - 15  Troponin I     Status: None   Collection Time: 09/13/16 10:56 AM  Result Value Ref Range   Troponin I <0.03 <0.03 ng/mL  APTT     Status: None   Collection Time: 09/13/16 10:56 AM  Result Value Ref Range   aPTT 33 24 - 36 seconds  Protime-INR     Status: None    Collection Time: 09/13/16 10:56 AM  Result Value Ref Range   Prothrombin Time 14.6 11.4 - 15.2 seconds   INR 1.13   Type and screen [facility]     Status: None   Collection Time: 09/13/16 10:56 AM  Result Value Ref Range   ABO/RH(D) A POS    Antibody Screen NEG    Sample Expiration 09/16/2016    Ct Abdomen  Pelvis W Contrast  Result Date: 09/13/2016 CLINICAL DATA:  Rectal bleeding.  History of colorectal cancer. EXAM: CT ABDOMEN AND PELVIS WITH CONTRAST TECHNIQUE: Multidetector CT imaging of the abdomen and pelvis was performed using the standard protocol following bolus administration of intravenous contrast. CONTRAST:  137m ISOVUE-300 IOPAMIDOL (ISOVUE-300) INJECTION 61% COMPARISON:  CT scan of March 17, 2016. FINDINGS: Lower chest: Moderate size hiatal hernia is noted. Visualized lung bases are unremarkable. Hepatobiliary: Stable hepatic cysts.  No gallstones are noted. Pancreas: Unremarkable. No pancreatic ductal dilatation or surrounding inflammatory changes. Spleen: Normal in size without focal abnormality. Adrenals/Urinary Tract: Adrenal glands are unremarkable. Stable bilateral cortical and parapelvic cysts are noted. Nonobstructive left renal calculus is noted. No hydronephrosis is noted. Urinary bladder is unremarkable. Stomach/Bowel: Colostomy is again noted in left lower quadrant, with large peristomal hernia containing loops of colon. No bowel dilatation or obstruction is noted. There is noted fluid within the distal rectum. Vascular/Lymphatic: Aortic atherosclerosis. No enlarged abdominal or pelvic lymph nodes. Reproductive: Prostate is unremarkable. Other: Stable presacral soft tissue density is noted most consistent with post radiation change. Musculoskeletal: No acute or significant osseous findings. IMPRESSION: Moderate size hiatal hernia. Nonobstructive left renal calculus. Stable bilateral renal cortical and parapelvic cysts. No hydronephrosis or renal  obstruction is noted. Continued presence of colostomy in left lower quadrant of abdomen, with large peristomal hernia containing loops of colon. No abnormal bowel dilatation or obstruction is noted. Aortic atherosclerosis. Stable presacral soft tissue density is noted most consistent with post radiation scarring. Small amount of fluid is seen in the distal portion of the rectum which was not present on prior exam. Hemorrhage or infection in this area cannot be excluded. Electronically Signed   By: JMarijo Conception M.D.   On: 09/13/2016 12:48    Blood pressure (!) 135/99, pulse (!) 102, temperature 98.3 F (36.8 C), temperature source Oral, resp. rate 19, height 5' 10"  (1.778 m), weight 189 lb (85.7 kg), SpO2 97 %.  Physical Exam: GENERAL: He is awake alert and oriented resting on the ER stretcher.  HEENT:  Head is normocephalic.  Pupils are equal reactive to light.  Extraocular movements are intact. Sclera is clear.  Pharynx is clear.  NECK:  Supple with no palpable mass and no adenopathy.  LUNGS:  Clear without rales rhonchi or wheezes.  HEART:  Regular rhythm S1-S2, without murmur.  ABDOMEN: Does have some swelling above and lateral to the colostomy in the left mid abdomen partially reducible consistent with a large paracolostomy hernia. The colostomy is draining soft brown stool. Abdomen is soft and nontender with no palpable mass.  PERINEUM: There was some dark bloody stain on the skin of the perineum. This was wiped away with tissue paper there is a approximately 1-2 mm opening in the perineum when pressed a small amount of bloodstained fluid came out. There was no surrounding erythema and no palpable mass.  NEUROLOGIC:  Awake alert and moving all extremities.  EXTREMITIES:  Well-developed well-nourished with no dependent edema.  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 He was able to walk easily. He has had some liquids to drink and also crackers which he is tolerating well.  He appears to be in satisfactory condition for discharge home. The nurse applied a pad to the perineal area.  Instructions include tucking gauze  or pad in underwear over the next few days to absorb any drainage. Also advised him when he gets up during the night to empty his bladder seroma side of the bed for 1 or 2 minutes before standing and plan to urinate sitting down to help avoid dizziness.  Also will consider possible role of PET scanning and oncology consultation and make a plan to follow-up in the office. I discussed this with Dr.Kinner.  Rochel Brome, MD 09/13/2016, 3:28 PM

## 2016-10-28 ENCOUNTER — Inpatient Hospital Stay (HOSPITAL_BASED_OUTPATIENT_CLINIC_OR_DEPARTMENT_OTHER): Payer: Medicare Other | Admitting: Internal Medicine

## 2016-10-28 ENCOUNTER — Inpatient Hospital Stay: Payer: Medicare Other | Attending: Internal Medicine

## 2016-10-28 VITALS — BP 121/82 | HR 92 | Temp 98.3°F | Resp 16 | Wt 183.2 lb

## 2016-10-28 DIAGNOSIS — I10 Essential (primary) hypertension: Secondary | ICD-10-CM | POA: Insufficient documentation

## 2016-10-28 DIAGNOSIS — I2510 Atherosclerotic heart disease of native coronary artery without angina pectoris: Secondary | ICD-10-CM | POA: Diagnosis not present

## 2016-10-28 DIAGNOSIS — C20 Malignant neoplasm of rectum: Secondary | ICD-10-CM

## 2016-10-28 DIAGNOSIS — G629 Polyneuropathy, unspecified: Secondary | ICD-10-CM

## 2016-10-28 DIAGNOSIS — Z79899 Other long term (current) drug therapy: Secondary | ICD-10-CM | POA: Insufficient documentation

## 2016-10-28 DIAGNOSIS — K435 Parastomal hernia without obstruction or  gangrene: Secondary | ICD-10-CM | POA: Insufficient documentation

## 2016-10-28 DIAGNOSIS — K219 Gastro-esophageal reflux disease without esophagitis: Secondary | ICD-10-CM | POA: Insufficient documentation

## 2016-10-28 DIAGNOSIS — Z9221 Personal history of antineoplastic chemotherapy: Secondary | ICD-10-CM

## 2016-10-28 DIAGNOSIS — Z923 Personal history of irradiation: Secondary | ICD-10-CM

## 2016-10-28 LAB — CBC WITH DIFFERENTIAL/PLATELET
Basophils Absolute: 0.1 10*3/uL (ref 0–0.1)
Basophils Relative: 1 %
EOS ABS: 0.1 10*3/uL (ref 0–0.7)
EOS PCT: 2 %
HCT: 44.3 % (ref 40.0–52.0)
Hemoglobin: 15.4 g/dL (ref 13.0–18.0)
LYMPHS ABS: 0.9 10*3/uL — AB (ref 1.0–3.6)
Lymphocytes Relative: 13 %
MCH: 29.5 pg (ref 26.0–34.0)
MCHC: 34.8 g/dL (ref 32.0–36.0)
MCV: 84.9 fL (ref 80.0–100.0)
MONO ABS: 0.6 10*3/uL (ref 0.2–1.0)
MONOS PCT: 8 %
Neutro Abs: 5.3 10*3/uL (ref 1.4–6.5)
Neutrophils Relative %: 76 %
PLATELETS: 246 10*3/uL (ref 150–440)
RBC: 5.22 MIL/uL (ref 4.40–5.90)
RDW: 15 % — AB (ref 11.5–14.5)
WBC: 6.9 10*3/uL (ref 3.8–10.6)

## 2016-10-28 LAB — COMPREHENSIVE METABOLIC PANEL
ALT: 19 U/L (ref 17–63)
AST: 18 U/L (ref 15–41)
Albumin: 4.2 g/dL (ref 3.5–5.0)
Alkaline Phosphatase: 48 U/L (ref 38–126)
Anion gap: 9 (ref 5–15)
BUN: 23 mg/dL — AB (ref 6–20)
CHLORIDE: 101 mmol/L (ref 101–111)
CO2: 25 mmol/L (ref 22–32)
CREATININE: 0.99 mg/dL (ref 0.61–1.24)
Calcium: 9.2 mg/dL (ref 8.9–10.3)
GFR calc non Af Amer: >60 mL/min (ref >60)
Glucose, Bld: 100 mg/dL — ABNORMAL HIGH (ref 65–99)
POTASSIUM: 3.6 mmol/L (ref 3.5–5.1)
SODIUM: 135 mmol/L (ref 135–145)
Total Bilirubin: 0.5 mg/dL (ref 0.3–1.2)
Total Protein: 7.1 g/dL (ref 6.5–8.1)

## 2016-10-28 NOTE — Progress Notes (Signed)
Patient is here today for a follow up. Patient states no new concerns today.  

## 2016-10-28 NOTE — Assessment & Plan Note (Addendum)
#    Rectal cancer/ abdominal discomfort- AUG 2018-CT scan shows no evidence of recurrence; however see discussion below. Abdominal discomfort- Likely secondary to parastomal hernia. Patient reluctant to have it operated. No evidence of any obstruction.  # ? Perineal drainage- small amount of fluid collection noted on CT scan? Etiology. Questioned recurrence. Recommend PET scan for further evaluation.  # Peripheral Neuropathy- G-2; stable. Reluctant to try any medications.  # follow up in 1 month/PET prior. Discussed with Dr.Smith.

## 2016-10-28 NOTE — Progress Notes (Signed)
[place] OFFICE PROGRESS NOTE  Patient Care Team: Idelle Crouch, MD as PCP - General (Internal Medicine)  Cancer Staging Rectal cancer [facility]) Staging form: Colon and Rectum, AJCC 7th Edition - Clinical stage from 06/15/2009: Stage IIA (T3, N0, M0) - Signed by Evlyn Kanner, NP on 09/16/2014    Oncology History   # 2010- rectal cancer T3 N0 M0, clinically stage II. At that time he also had a small but nondiagnostic pelvic node, negative by PET scan. Status post treatment with concurrent chemotherapy and radiation. Patient completed short course of Xeloda with radiation, then abdominoperineal resection in 2010 with FOLFOX starting in April 2011 [Dr.Smith; Dr.Gittin]  # FEB 2018- CT A/P- chronic presacral changes; parastomal hernia.      Rectal cancer ([place])     INTERVAL HISTORY:  Jeffrey Roberson 66 y.o.  male pleasant patient above history of Rectal cancer stage II status post chemoradiation followed by APR followed by adjuvant chemotherapy is here for follow-up.  Recently patient was evaluated at the emergency room and he presented with abdominal discomfort and also micturition syncope. Patient had noted bloody discharge from his perineum. CT scan in the emergency room showed fluid collection in the perineum; but no obvious mass. He was also evaluated by Dr. Tamala Julian in the emergency room.  Patient's appetite is good. Continues to chronic abdominal discomfort. Not any worse. No constipation. No blood in his ostomy bag. Chronic tingling and numbness.    REVIEW OF SYSTEMS:  A complete 10 point review of system is done which is negative except mentioned above/history of present illness.   PAST MEDICAL HISTORY :  Past Medical History:  Diagnosis Date  . Cancer [facility])    Rectal   . Coronary artery disease   . DDD (degenerative disc disease), cervical   . Diverticulosis   . Fundic gland polyps of stomach, benign   . GERD (gastroesophageal reflux disease)   .  Hemorrhage of rectum and anus   . History of hiatal hernia   . Hypertension   . Pneumothorax, spontaneous, tension   . Rectal cancer ([place]) 09/11/2014   colon    PAST SURGICAL HISTORY :   Past Surgical History:  Procedure Laterality Date  . ABDOMINOPERINEAL PROCTOCOLECTOMY  10/18/11  . COLON SURGERY    . COLONOSCOPY N/A 12/23/2014   Procedure: COLONOSCOPY;  Surgeon: Lollie Sails, MD;  Location: [facility] ENDOSCOPY;  Service: Endoscopy;  Laterality: N/A;  . Colostomy Placement      FAMILY HISTORY :   Family History  Problem Relation Age of Onset  . Colon cancer Father     SOCIAL HISTORY:   Social History  Substance Use Topics  . Smoking status: Never Smoker  . Smokeless tobacco: Never Used  . Alcohol use No    ALLERGIES:  is allergic to other and penicillin v potassium.  MEDICATIONS:  Current Outpatient Prescriptions  Medication Sig Dispense Refill  . lisinopril-hydrochlorothiazide (PRINZIDE,ZESTORETIC) 10-12.5 MG per tablet TAKE 1 TABLET DAILY    . omeprazole (PRILOSEC) 20 MG capsule Take by mouth.     No current facility-administered medications for this visit.     PHYSICAL EXAMINATION: ECOG PERFORMANCE STATUS: 0 - Asymptomatic  BP 121/82 (BP Location: Left Arm, Patient Position: Sitting)   Pulse 92   Temp 98.3 F (36.8 C)   Resp 16   Wt 183 lb 3.2 oz (83.1 kg)   BMI 26.29 kg/m   Filed Weights   10/28/16 1354  Weight: 183 lb  3.2 oz (83.1 kg)    GENERAL: Well-nourished well-developed; Alert, no distress and comfortable.   Alone.  EYES: no pallor or icterus OROPHARYNX: no thrush or ulceration; good dentition  NECK: supple, no masses felt LYMPH:  no palpable lymphadenopathy in the cervical, axillary or inguinal regions LUNGS: clear to auscultation and  No wheeze or crackles HEART/CVS: regular rate & rhythm and no murmurs; No lower extremity edema ABDOMEN:abdomen soft, non-tender and normal bowel sounds; Large hernia noted around the ostomy. No blood in the  colostomy.  Musculoskeletal:no cyanosis of digits and no clubbing  PSYCH: alert & oriented x 3 with fluent speech NEURO: no focal motor/sensory deficits SKIN:  no rashes or significant lesions  LABORATORY DATA:  I have reviewed the data as listed    Component Value Date/Time   NA 135 10/28/2016 1332   K 3.6 10/28/2016 1332   CL 101 10/28/2016 1332   CO2 25 10/28/2016 1332   GLUCOSE 100 (H) 10/28/2016 1332   BUN 23 (H) 10/28/2016 1332   CREATININE 0.99 10/28/2016 1332   CREATININE 1.01 03/15/2012 0950   CALCIUM 9.2 10/28/2016 1332   PROT 7.1 10/28/2016 1332   PROT 7.2 11/10/2011 1010   ALBUMIN 4.2 10/28/2016 1332   ALBUMIN 3.8 11/10/2011 1010   AST 18 10/28/2016 1332   AST 17 11/10/2011 1010   ALT 19 10/28/2016 1332   ALT 22 11/10/2011 1010   ALKPHOS 48 10/28/2016 1332   ALKPHOS 69 11/10/2011 1010   BILITOT 0.5 10/28/2016 1332   BILITOT 0.5 11/10/2011 1010   GFRNONAA >60 10/28/2016 1332   GFRNONAA >60 03/15/2012 0950   GFRAA >60 10/28/2016 1332   GFRAA >60 03/15/2012 0950    No results found for: SPEP, UPEP  Lab Results  Component Value Date   WBC 6.9 10/28/2016   NEUTROABS 5.3 10/28/2016   HGB 15.4 10/28/2016   HCT 44.3 10/28/2016   MCV 84.9 10/28/2016   PLT 246 10/28/2016      Chemistry      Component Value Date/Time   NA 135 10/28/2016 1332   K 3.6 10/28/2016 1332   CL 101 10/28/2016 1332   CO2 25 10/28/2016 1332   BUN 23 (H) 10/28/2016 1332   CREATININE 0.99 10/28/2016 1332   CREATININE 1.01 03/15/2012 0950      Component Value Date/Time   CALCIUM 9.2 10/28/2016 1332   ALKPHOS 48 10/28/2016 1332   ALKPHOS 69 11/10/2011 1010   AST 18 10/28/2016 1332   AST 17 11/10/2011 1010   ALT 19 10/28/2016 1332   ALT 22 11/10/2011 1010   BILITOT 0.5 10/28/2016 1332   BILITOT 0.5 11/10/2011 1010       RADIOGRAPHIC STUDIES: I have personally reviewed the radiological images as listed and agreed with the findings in the report. No results found.    ASSESSMENT & PLAN:  Rectal cancer ([place]) #  Rectal cancer/ abdominal discomfort- AUG 2018-CT scan shows no evidence of recurrence; however see discussion below. Abdominal discomfort- Likely secondary to parastomal hernia. Patient reluctant to have it operated. No evidence of any obstruction.  # ? Perineal drainage- small amount of fluid collection noted on CT scan? Etiology. Questioned recurrence. Recommend PET scan for further evaluation.  # Peripheral Neuropathy- G-2; stable. Reluctant to try any medications.  # follow up in 1 month/PET prior. Discussed with Dr.Smith.    Orders Placed This Encounter  Procedures  . NM PET Image Restag (PS) Skull Base To Thigh    Standing Status:  Future    Standing Expiration Date:   10/28/2017    Order Specific Question:   If indicated for the ordered procedure, I authorize the administration of a radiopharmaceutical per Radiology protocol    Answer:   Yes    Order Specific Question:   Preferred imaging location?    Answer:   [facility]    Order Specific Question:   Radiology Contrast Protocol - do NOT remove file path    Answer:   [email or web address]    Order Specific Question:   Reason for Exam additional comments    Answer:   rectal cancer; abdominal discomfort   All questions were answered. The patient knows to call the clinic with any problems, questions or concerns.      Cammie Sickle, MD 11/03/2016 8:39 PM

## 2016-10-29 LAB — CEA: CEA1: 4.1 ng/mL (ref 0.0–4.7)

## 2016-11-25 ENCOUNTER — Ambulatory Visit: Payer: Medicare Other

## 2016-11-28 ENCOUNTER — Ambulatory Visit: Payer: Medicare Other | Admitting: Internal Medicine

## 2017-01-23 ENCOUNTER — Encounter
Admission: RE | Admit: 2017-01-23 | Discharge: 2017-01-23 | Disposition: A | Discharge status: Home / Self Care | Payer: Medicare Other | Source: Ambulatory Visit | Attending: Internal Medicine | Admitting: Internal Medicine

## 2017-01-23 DIAGNOSIS — C20 Malignant neoplasm of rectum: Secondary | ICD-10-CM | POA: Diagnosis present

## 2017-01-23 LAB — GLUCOSE, CAPILLARY: GLUCOSE-CAPILLARY: 78 mg/dL (ref 65–99)

## 2017-01-23 MED ORDER — FLUDEOXYGLUCOSE F - 18 (FDG) INJECTION
13.2100 | Freq: Once | INTRAVENOUS | Status: AC | PRN
  Administered 2017-01-23: 13.21 via INTRAVENOUS

## 2017-01-25 ENCOUNTER — Inpatient Hospital Stay: Payer: Medicare Other | Attending: Internal Medicine | Admitting: Internal Medicine

## 2017-01-25 ENCOUNTER — Encounter: Payer: Self-pay | Admitting: Internal Medicine

## 2017-01-25 VITALS — BP 131/87 | HR 93 | Temp 97.4°F | Resp 16 | Wt 181.0 lb

## 2017-01-25 DIAGNOSIS — K219 Gastro-esophageal reflux disease without esophagitis: Secondary | ICD-10-CM | POA: Insufficient documentation

## 2017-01-25 DIAGNOSIS — G629 Polyneuropathy, unspecified: Secondary | ICD-10-CM

## 2017-01-25 DIAGNOSIS — K5900 Constipation, unspecified: Secondary | ICD-10-CM | POA: Insufficient documentation

## 2017-01-25 DIAGNOSIS — M5030 Other cervical disc degeneration, unspecified cervical region: Secondary | ICD-10-CM | POA: Insufficient documentation

## 2017-01-25 DIAGNOSIS — Z923 Personal history of irradiation: Secondary | ICD-10-CM | POA: Insufficient documentation

## 2017-01-25 DIAGNOSIS — C20 Malignant neoplasm of rectum: Secondary | ICD-10-CM

## 2017-01-25 DIAGNOSIS — Z85048 Personal history of other malignant neoplasm of rectum, rectosigmoid junction, and anus: Secondary | ICD-10-CM | POA: Diagnosis not present

## 2017-01-25 DIAGNOSIS — I10 Essential (primary) hypertension: Secondary | ICD-10-CM | POA: Insufficient documentation

## 2017-01-25 DIAGNOSIS — Z79899 Other long term (current) drug therapy: Secondary | ICD-10-CM | POA: Diagnosis not present

## 2017-01-25 DIAGNOSIS — K7689 Other specified diseases of liver: Secondary | ICD-10-CM | POA: Diagnosis not present

## 2017-01-25 DIAGNOSIS — Z933 Colostomy status: Secondary | ICD-10-CM | POA: Insufficient documentation

## 2017-01-25 DIAGNOSIS — K435 Parastomal hernia without obstruction or  gangrene: Secondary | ICD-10-CM | POA: Diagnosis not present

## 2017-01-25 DIAGNOSIS — I2510 Atherosclerotic heart disease of native coronary artery without angina pectoris: Secondary | ICD-10-CM | POA: Insufficient documentation

## 2017-01-25 DIAGNOSIS — N200 Calculus of kidney: Secondary | ICD-10-CM | POA: Diagnosis not present

## 2017-01-25 DIAGNOSIS — Z9221 Personal history of antineoplastic chemotherapy: Secondary | ICD-10-CM | POA: Diagnosis not present

## 2017-01-25 NOTE — Progress Notes (Signed)
[facility] OFFICE PROGRESS NOTE  Patient Care Team: Idelle Crouch, MD as PCP - General (Internal Medicine)  Cancer Staging Rectal cancer [facility]) Staging form: Colon and Rectum, AJCC 7th Edition - Clinical stage from 06/15/2009: Stage IIA (T3, N0, M0) - Signed by Evlyn Kanner, NP on 09/16/2014    Oncology History   # 2010- rectal cancer T3 N0 M0, clinically stage II. At that time he also had a small but nondiagnostic pelvic node, negative by PET scan. Status post treatment with concurrent chemotherapy and radiation. Patient completed short course of Xeloda with radiation, then abdominoperineal resection in 2010 with FOLFOX starting in April 2011 [Dr.Smith; Dr.Gittin]  # FEB 2018- CT A/P- chronic presacral changes; parastomal hernia. DEC 2018- PET-NED     Rectal cancer ([place])     INTERVAL HISTORY:  Jeffrey Roberson 66 y.o.  male pleasant patient above history of Rectal cancer stage II status post chemoradiation followed by APR [2010] followed by adjuvant chemotherapy is here for follow-up/reviewed the results of his PET scan.  Patient continues to have abdominal discomfort-which is chronic.  Attributable to parastomal hernia.  He admits to mild constipation.  He denies any further bloody perineal discharge.  Patient's appetite is good.  No blood in his ostomy bag. Chronic tingling and numbness.    REVIEW OF SYSTEMS:  A complete 10 point review of system is done which is negative except mentioned above/history of present illness.   PAST MEDICAL HISTORY :  Past Medical History:  Diagnosis Date  . Cancer [facility])    Rectal   . Coronary artery disease   . DDD (degenerative disc disease), cervical   . Diverticulosis   . Fundic gland polyps of stomach, benign   . GERD (gastroesophageal reflux disease)   . Hemorrhage of rectum and anus   . History of hiatal hernia   . Hypertension   . Pneumothorax, spontaneous, tension   . Rectal cancer ([place]) 09/11/2014    colon    PAST SURGICAL HISTORY :   Past Surgical History:  Procedure Laterality Date  . ABDOMINOPERINEAL PROCTOCOLECTOMY  10/18/11  . COLON SURGERY    . COLONOSCOPY N/A 12/23/2014   Procedure: COLONOSCOPY;  Surgeon: Lollie Sails, MD;  Location: [facility] ENDOSCOPY;  Service: Endoscopy;  Laterality: N/A;  . Colostomy Placement      FAMILY HISTORY :   Family History  Problem Relation Age of Onset  . Colon cancer Father     SOCIAL HISTORY:   Social History   Tobacco Use  . Smoking status: Never Smoker  . Smokeless tobacco: Never Used  Substance Use Topics  . Alcohol use: No  . Drug use: No    ALLERGIES:  is allergic to other and penicillin v potassium.  MEDICATIONS:  Current Outpatient Medications  Medication Sig Dispense Refill  . lisinopril-hydrochlorothiazide (PRINZIDE,ZESTORETIC) 10-12.5 MG per tablet TAKE 1 TABLET DAILY    . omeprazole (PRILOSEC) 20 MG capsule Take by mouth.     No current facility-administered medications for this visit.     PHYSICAL EXAMINATION: ECOG PERFORMANCE STATUS: 0 - Asymptomatic  BP 131/87 (BP Location: Right Arm, Patient Position: Sitting)   Pulse 93   Temp (!) 97.4 F (36.3 C) (Tympanic)   Resp 16   Wt 181 lb (82.1 kg)   BMI 25.97 kg/m   Filed Weights   01/25/17 1059  Weight: 181 lb (82.1 kg)    GENERAL: Well-nourished well-developed; Alert, no distress and comfortable.   Alone.  EYES: no pallor or icterus OROPHARYNX: no thrush or ulceration; good dentition  NECK: supple, no masses felt LYMPH:  no palpable lymphadenopathy in the cervical, axillary or inguinal regions LUNGS: clear to auscultation and  No wheeze or crackles HEART/CVS: regular rate & rhythm and no murmurs; No lower extremity edema ABDOMEN:abdomen soft, non-tender and normal bowel sounds; Large hernia noted around the ostomy. No blood in the colostomy.  Musculoskeletal:no cyanosis of digits and no clubbing  PSYCH: alert & oriented x 3 with fluent  speech NEURO: no focal motor/sensory deficits SKIN:  no rashes or significant lesions  LABORATORY DATA:  I have reviewed the data as listed    Component Value Date/Time   NA 135 10/28/2016 1332   K 3.6 10/28/2016 1332   CL 101 10/28/2016 1332   CO2 25 10/28/2016 1332   GLUCOSE 100 (H) 10/28/2016 1332   BUN 23 (H) 10/28/2016 1332   CREATININE 0.99 10/28/2016 1332   CREATININE 1.01 03/15/2012 0950   CALCIUM 9.2 10/28/2016 1332   PROT 7.1 10/28/2016 1332   PROT 7.2 11/10/2011 1010   ALBUMIN 4.2 10/28/2016 1332   ALBUMIN 3.8 11/10/2011 1010   AST 18 10/28/2016 1332   AST 17 11/10/2011 1010   ALT 19 10/28/2016 1332   ALT 22 11/10/2011 1010   ALKPHOS 48 10/28/2016 1332   ALKPHOS 69 11/10/2011 1010   BILITOT 0.5 10/28/2016 1332   BILITOT 0.5 11/10/2011 1010   GFRNONAA >60 10/28/2016 1332   GFRNONAA >60 03/15/2012 0950   GFRAA >60 10/28/2016 1332   GFRAA >60 03/15/2012 0950    No results found for: SPEP, UPEP  Lab Results  Component Value Date   WBC 6.9 10/28/2016   NEUTROABS 5.3 10/28/2016   HGB 15.4 10/28/2016   HCT 44.3 10/28/2016   MCV 84.9 10/28/2016   PLT 246 10/28/2016      Chemistry      Component Value Date/Time   NA 135 10/28/2016 1332   K 3.6 10/28/2016 1332   CL 101 10/28/2016 1332   CO2 25 10/28/2016 1332   BUN 23 (H) 10/28/2016 1332   CREATININE 0.99 10/28/2016 1332   CREATININE 1.01 03/15/2012 0950      Component Value Date/Time   CALCIUM 9.2 10/28/2016 1332   ALKPHOS 48 10/28/2016 1332   ALKPHOS 69 11/10/2011 1010   AST 18 10/28/2016 1332   AST 17 11/10/2011 1010   ALT 19 10/28/2016 1332   ALT 22 11/10/2011 1010   BILITOT 0.5 10/28/2016 1332   BILITOT 0.5 11/10/2011 1010       RADIOGRAPHIC STUDIES: I have personally reviewed the radiological images as listed and agreed with the findings in the report. Nm Pet Image Restag (ps) Skull Base To Thigh  Result Date: 01/23/2017 CLINICAL DATA:  Subsequent treatment strategy for rectal  carcinoma. EXAM: NUCLEAR MEDICINE PET SKULL BASE TO THIGH TECHNIQUE: 13.2 mCi F-18 FDG was injected intravenously. Full-ring PET imaging was performed from the skull base to thigh after the radiotracer. CT data was obtained and used for attenuation correction and anatomic localization. FASTING BLOOD GLUCOSE:  Value: 78 mg/dl COMPARISON:  09/13/2016 FINDINGS: NECK No hypermetabolic lymph nodes in the neck. CHEST No hypermetabolic mediastinal or hilar nodes. No suspicious pulmonary nodules on the CT scan. ABDOMEN/PELVIS Multiple benign hepatic cysts. Normal adrenal glands and pancreas. Bilateral nephrolithiasis. LEFT lower quadrant colostomy. There is a large peristomal hernia containing nonobstructed loops of large small bowel. No abnormal metabolic activity. No abnormal metabolic activity at the proctectomy site.  Presacral thickening without metabolic activity. Prostate normal. SKELETON No focal hypermetabolic activity to suggest skeletal metastasis. IMPRESSION: 1. No evidence of local rectal carcinoma recurrence the pelvis. 2. No evidence of metastatic disease. 3. Large peristomal hernia along the LEFT lower quadrant colostomy without evidence of obstruction. Electronically Signed   By: Suzy Bouchard M.D.   On: 01/23/2017 13:14    IMPRESSION: 1. No evidence of local rectal carcinoma recurrence the pelvis. 2. No evidence of metastatic disease. 3. Large peristomal hernia along the LEFT lower quadrant colostomy without evidence of obstruction.   Electronically Signed   By: Suzy Bouchard M.D.   On: 01/23/2017 13:14  ASSESSMENT & PLAN:  Rectal cancer ([place]) #  Rectal cancer/ abdominal discomfort-DEC 2018- PET scan-no evidence of local recurrence or distant metastatic disease.  # Abdominal discomfort- Likely secondary to parastomal hernia. No evidence of any obstruction.  Given the absence of any recurrence noted on the PET scan-I think is reasonable to follow-up with Dr. Tamala Julian for surgical option  for his parastomal hernia/especially symptomatic.  He wants to follow-up with surgery after Christmas holidays.  # rectal cancer- father in 74s; personal rectal cancer-59.2 sisters [younger]; no children. ? Mom- colon cancer?.  Will check if MMR could be done on old specimen.  # Constipation-MiraLAX as needed.  # Peripheral Neuropathy- G-2; stable. Reluctant to try any medications.  # follow up in 6 momts/labs CEA.  # 25 minutes face-to-face with the patient discussing the above plan of care; more than 50% of time spent on prognosis/ natural history; counseling and coordination.   # I reviewed the blood work- with the patient in detail; also reviewed the imaging independently [as summarized above]; and with the patient in detail.     Cc; Dr.Smith   Orders Placed This Encounter  Procedures  . CBC with Differential/Platelet    Standing Status:   Future    Standing Expiration Date:   01/25/2018  . Comprehensive metabolic panel    Standing Status:   Future    Standing Expiration Date:   01/25/2018  . CEA    Standing Status:   Future    Standing Expiration Date:   01/25/2018   All questions were answered. The patient knows to call the clinic with any problems, questions or concerns.      Cammie Sickle, MD 01/25/2017 12:03 PM

## 2017-01-25 NOTE — Assessment & Plan Note (Addendum)
#  Rectal cancer/ abdominal discomfort-DEC 2018- PET scan-no evidence of local recurrence or distant metastatic disease.  # Abdominal discomfort- Likely secondary to parastomal hernia. No evidence of any obstruction.  Given the absence of any recurrence noted on the PET scan-I think is reasonable to follow-up with Dr. Tamala Julian for surgical option for his parastomal hernia/especially symptomatic.  He wants to follow-up with surgery after Christmas holidays.  # rectal cancer- father in 29s; personal rectal cancer-59.2 sisters [younger]; no children. ? Mom- colon cancer?.  Will check if MMR could be done on old specimen.  # Constipation-MiraLAX as needed.  # Peripheral Neuropathy- G-2; stable. Reluctant to try any medications.  # follow up in 6 momts/labs CEA.  # 25 minutes face-to-face with the patient discussing the above plan of care; more than 50% of time spent on prognosis/ natural history; counseling and coordination.   # I reviewed the blood work- with the patient in detail; also reviewed the imaging independently [as summarized above]; and with the patient in detail.     Cc; Dr.Smith

## 2017-03-07 ENCOUNTER — Other Ambulatory Visit: Payer: Self-pay

## 2017-03-07 ENCOUNTER — Encounter
Admission: RE | Admit: 2017-03-07 | Discharge: 2017-03-07 | Disposition: A | Discharge status: Home / Self Care | Payer: Medicare Other | Source: Ambulatory Visit | Attending: Surgery | Admitting: Surgery

## 2017-03-07 DIAGNOSIS — Z01812 Encounter for preprocedural laboratory examination: Secondary | ICD-10-CM | POA: Insufficient documentation

## 2017-03-07 DIAGNOSIS — I10 Essential (primary) hypertension: Secondary | ICD-10-CM | POA: Insufficient documentation

## 2017-03-07 DIAGNOSIS — Z01810 Encounter for preprocedural cardiovascular examination: Secondary | ICD-10-CM | POA: Diagnosis not present

## 2017-03-07 LAB — COMPREHENSIVE METABOLIC PANEL
ALBUMIN: 4.3 g/dL (ref 3.5–5.0)
ALK PHOS: 57 U/L (ref 38–126)
ALT: 17 U/L (ref 17–63)
AST: 19 U/L (ref 15–41)
Anion gap: 8 (ref 5–15)
BUN: 20 mg/dL (ref 6–20)
CALCIUM: 9.2 mg/dL (ref 8.9–10.3)
CO2: 26 mmol/L (ref 22–32)
CREATININE: 0.92 mg/dL (ref 0.61–1.24)
Chloride: 101 mmol/L (ref 101–111)
GFR calc Af Amer: >60 mL/min (ref >60)
GFR calc non Af Amer: >60 mL/min (ref >60)
Glucose, Bld: 88 mg/dL (ref 65–99)
Potassium: 3.8 mmol/L (ref 3.5–5.1)
SODIUM: 135 mmol/L (ref 135–145)
Total Bilirubin: 0.6 mg/dL (ref 0.3–1.2)
Total Protein: 7.2 g/dL (ref 6.5–8.1)

## 2017-03-07 LAB — DIFFERENTIAL
Basophils Absolute: 0.1 10*3/uL (ref 0–0.1)
Basophils Relative: 1 %
Eosinophils Absolute: 0.1 10*3/uL (ref 0–0.7)
Eosinophils Relative: 2 %
LYMPHS PCT: 14 %
Lymphs Abs: 0.9 10*3/uL — ABNORMAL LOW (ref 1.0–3.6)
Monocytes Absolute: 0.7 10*3/uL (ref 0.2–1.0)
Monocytes Relative: 11 %
NEUTROS ABS: 4.8 10*3/uL (ref 1.4–6.5)
NEUTROS PCT: 72 %

## 2017-03-07 LAB — CBC
HCT: 47 % (ref 40.0–52.0)
Hemoglobin: 15.8 g/dL (ref 13.0–18.0)
MCH: 29.2 pg (ref 26.0–34.0)
MCHC: 33.7 g/dL (ref 32.0–36.0)
MCV: 86.7 fL (ref 80.0–100.0)
PLATELETS: 204 10*3/uL (ref 150–440)
RBC: 5.42 MIL/uL (ref 4.40–5.90)
RDW: 13.8 % (ref 11.5–14.5)
WBC: 6.6 10*3/uL (ref 3.8–10.6)

## 2017-03-07 NOTE — Patient Instructions (Signed)
Your procedure is scheduled on: Friday 03/17/17 Report to [place]. To find out your arrival time please call [phone] between 1PM - 3PM on Thursday 03/16/17.  Remember: Instructions that are not followed completely may result in serious medical risk, up to and including death, or upon the discretion of your surgeon and anesthesiologist your surgery may need to be rescheduled.     _X__ 1. Do not eat food after midnight the night before your procedure.                 No gum chewing or hard candies. You may drink clear liquids up to 2 hours                 before you are scheduled to arrive for your surgery- DO not drink clear                 liquids within 2 hours of the start of your surgery.                 Clear Liquids include:  water, apple juice without pulp, clear carbohydrate                 drink such as Clearfast of Gartorade, Black Coffee or Tea (Do not add                 anything to coffee or tea).  __X__2.  On the morning of surgery brush your teeth with toothpaste and water, you  may rinse your mouth with mouthwash if you wish.  Do not swallow any               toothpaste of mouthwash.     _X__ 3.  No Alcohol for 24 hours before or after surgery.   _X__ 4.  Do Not Smoke or use e-cigarettes For 24 Hours Prior to Your Surgery.                 Do not use any chewable tobacco products for at least 6 hours prior to                 surgery.  ____  5.  Bring all medications with you on the day of surgery if instructed.   ____  6.  Notify your doctor if there is any change in your medical condition      (cold, fever, infections).     Do not wear jewelry, make-up, hairpins, clips or nail polish. Do not wear lotions, powders, or perfumes. You may wear deodorant. Do not shave 48 hours prior to surgery. Men may shave face and neck. Do not bring valuables to the hospital.    [facility] is not responsible for any belongings  or valuables.  Contacts, dentures or bridgework may not be worn into surgery. Leave your suitcase in the car. After surgery it may be brought to your room. For patients admitted to the hospital, discharge time is determined by your treatment team.   Patients discharged the day of surgery will not be allowed to drive home.   Please read over the following fact sheets that you were given:   MRSA Information   __X__ Take these medicines the morning of surgery with A SIP OF WATER:    1. NONE  2.   3.   4.  5.  6.  ____ Fleet Enema (as directed)   __X__ Use CHG Soap as  directed  ____ Use inhalers on the day of surgery  ____ Stop metformin/Janumet/Farxiga 2 days prior to surgery    ____ Take 1/2 of usual insulin dose the night before surgery. No insulin the morning          of surgery.   ____ Stop Blood Thinners Coumadin/Plavix/Xarelto/Pleta/Pradaxa/Eliquis/Effient/Aspirin  on   __X__ Stop Anti-inflammatories such as Advil, Ibuprofen, Motrin, BC or Goodies  Powder, Naprosyn, Naproxen, Aleve    __X__ Stop herbal supplements, fish oil or vitamin E until after surgery.    ____ Bring C-Pap to the hospital.

## 2017-03-14 NOTE — Pre-Procedure Instructions (Signed)
Jeffrey Cowman, MD - 03/09/2017 2:45 PM EST Formatting of this note might be different from the original. Established Patient Visit   Chief Complaint: Chief Complaint  Patient presents with  . New Patient  Date of Service: 03/09/2017 Date of Birth: 1950/06/14 PCP: Idelle Crouch, MD  History of Present Illness: Mr. Jeffrey Roberson is a 67 y.o.male patient who returns for  1. Insignificant CAD by cardiac catheterization 02/09/2009 2. Essential hypertension 3. History of rectal cancer  Patient returns for preoperative cardiovascular evaluation prior to hernia repair surgery. Patient reports he is been doing well, denies chest pain or shortness of breath. He denies palpitations or heart racing. He denies peripheral edema. The patient was active until his hernia started to bother him. He does not do any structured exercise. 2D echocardiogram 09/17/2014 revealed normal left ventricular function, with LVEF greater than 55%, without evidence for scar or ischemia.  Patient has essential hypertension, blood pressure well controlled on lisinopril/HCTZ, which is well tolerated without apparent side effects. The patient follows a low-sodium, no added salt diet.  Past Medical and Surgical History  Past Medical History Past Medical History:  Diagnosis Date  . 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 Benign neoplasm of colon, unspecified  . CAD (coronary artery disease)  . Degenerative disc disease  with spinal stenosis  . Diverticulosis 10/18/2011, 10/20/2009, 06/04/2008  . Esophageal reflux  . Essential hypertension, benign  . Fundic gland polyps of stomach, benign 06/04/2008  . GERD (gastroesophageal reflux disease)  . Hemorrhage of rectum and anus  . HH (hiatus hernia) 06/04/2008  . Hiatal hernia  . History of colorectal malignant neoplasm  with colostomy placement 2010  . Hyperplastic colon polyp 10/18/2011, 06/04/2008  . Other symptoms involving digestive system(787.99)   . Rectal mass 06/04/2008  . Spontaneous pneumothorax  . Tubular adenoma of colon, unspecified 10/18/2011, 06/04/2008  . 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   Medications and Allergies  Current Medications  Current Outpatient Medications  Medication Sig Dispense Refill  . colostomy bags 2 1/2 " Misc Use 1 Bag once daily. 20 each 11  . lisinopril-hydrochlorothiazide (PRINZIDE,ZESTORETIC) 10-12.5 mg tablet TAKE 1 TABLET BY MOUTH EVERY DAY 90 tablet 3  . simethicone (MYLICON) 80 MG chewable tablet Take 80 mg by mouth every 6 (six) hours as needed for Flatulence   No current facility-administered medications for this visit.   Allergies: Penicillin v potassium  Social and Family History  Social History reports that he has never smoked. He has never used smokeless tobacco. He reports that he does not drink alcohol or use drugs.  Family History Family History  Problem Relation Age of Onset  . Colon polyps Father  . Colon cancer Father 16  . Prostate cancer Unknown  . Coronary Artery Disease (Blocked arteries around heart) Unknown   Review of Systems   Review of Systems: The patient denies chest pain, shortness of breath, orthopnea, paroxysmal nocturnal dyspnea, pedal edema, palpitations, heart racing, presyncope, syncope. Review of 12 Systems is negative except as described above.  Physical Examination   Vitals:BP 138/82  Pulse 84  Ht 165.1 cm (5\' 5" )  Wt 79.8 kg (176 lb)  BMI 29.29 kg/m  Ht:165.1 cm (5\' 5" ) Wt:79.8 kg (176 lb) GYJ:EHUD surface area is 1.91 meters squared. Body mass index is 29.29 kg/m.  HEENT: Pupils equally reactive to light  and accomodation  Neck: Supple without thyromegaly, carotid pulses 2+ Lungs: clear to auscultation bilaterally; no  wheezes, rales, rhonchi Heart: Regular rate and rhythm. No gallops, murmurs or rub Abdomen: soft nontender, nondistended, with normal bowel sounds Extremities: no cyanosis, clubbing, or edema Peripheral Pulses: 2+ in all extremities, 2+ femoral pulses bilaterally  Assessment   67 y.o. male with  1. Essential hypertension, benign  2. Coronary artery disease involving native coronary artery of native heart without angina pectoris  3. Pure hypercholesterolemia  4. Preop cardiovascular exam   67 year old gentleman referred for preoperative cardiovascular evaluation prior to hernia repair surgery. Patient has known insignificant coronary artery disease by prior cardiac catheterization. He has no prior history of myocardial infarction, stroke, congestive heart failure, diabetes or chronic kidney disease. Patient currently denies chest pain shortness of breath. He should be at low risk for serious cardiovascular complication during low risk hernia repair surgery.  Plan   1. Continue current medications  2. Counseled patient about low-sodium diet 3. DASH diet printed instructions given to the patient 4. Defer further cardiac diagnostics at this time 5. Proceed with hernia repair surgery 6. Return to clinic for follow-up in 1 year  No orders of the defined types were placed in this encounter.  Return in about 1 year (around 03/09/2018).  Jeffrey Cowman, MD PhD [facility]

## 2017-03-16 MED ORDER — VANCOMYCIN HCL IN DEXTROSE 1-5 GM/200ML-% IV SOLN
1000.0000 mg | Freq: Once | INTRAVENOUS | Status: AC
  Administered 2017-03-17: 1000 mg via INTRAVENOUS

## 2017-03-17 ENCOUNTER — Encounter
Admission: RE | Disposition: A | Discharge status: Home / Self Care | Payer: Self-pay | Source: Ambulatory Visit | Attending: Surgery

## 2017-03-17 ENCOUNTER — Observation Stay
Admission: RE | Admit: 2017-03-17 | Discharge: 2017-03-19 | Disposition: A | Discharge status: Home / Self Care | Payer: Medicare Other | Source: Ambulatory Visit | Attending: Surgery | Admitting: Surgery

## 2017-03-17 ENCOUNTER — Other Ambulatory Visit: Payer: Self-pay

## 2017-03-17 ENCOUNTER — Ambulatory Visit: Payer: Medicare Other | Admitting: Anesthesiology

## 2017-03-17 ENCOUNTER — Encounter: Payer: Self-pay | Admitting: Anesthesiology

## 2017-03-17 DIAGNOSIS — Z888 Allergy status to other drugs, medicaments and biological substances status: Secondary | ICD-10-CM | POA: Diagnosis not present

## 2017-03-17 DIAGNOSIS — Z885 Allergy status to narcotic agent status: Secondary | ICD-10-CM | POA: Diagnosis not present

## 2017-03-17 DIAGNOSIS — I2510 Atherosclerotic heart disease of native coronary artery without angina pectoris: Secondary | ICD-10-CM | POA: Diagnosis not present

## 2017-03-17 DIAGNOSIS — G709 Myoneural disorder, unspecified: Secondary | ICD-10-CM | POA: Insufficient documentation

## 2017-03-17 DIAGNOSIS — Z923 Personal history of irradiation: Secondary | ICD-10-CM | POA: Insufficient documentation

## 2017-03-17 DIAGNOSIS — Z8249 Family history of ischemic heart disease and other diseases of the circulatory system: Secondary | ICD-10-CM | POA: Insufficient documentation

## 2017-03-17 DIAGNOSIS — I10 Essential (primary) hypertension: Secondary | ICD-10-CM | POA: Insufficient documentation

## 2017-03-17 DIAGNOSIS — Z85048 Personal history of other malignant neoplasm of rectum, rectosigmoid junction, and anus: Secondary | ICD-10-CM | POA: Insufficient documentation

## 2017-03-17 DIAGNOSIS — K435 Parastomal hernia without obstruction or  gangrene: Secondary | ICD-10-CM | POA: Diagnosis not present

## 2017-03-17 DIAGNOSIS — Z79899 Other long term (current) drug therapy: Secondary | ICD-10-CM | POA: Insufficient documentation

## 2017-03-17 DIAGNOSIS — Z933 Colostomy status: Secondary | ICD-10-CM | POA: Diagnosis not present

## 2017-03-17 DIAGNOSIS — K439 Ventral hernia without obstruction or gangrene: Secondary | ICD-10-CM | POA: Diagnosis present

## 2017-03-17 DIAGNOSIS — Z880 Allergy status to penicillin: Secondary | ICD-10-CM | POA: Insufficient documentation

## 2017-03-17 DIAGNOSIS — Z8719 Personal history of other diseases of the digestive system: Secondary | ICD-10-CM | POA: Insufficient documentation

## 2017-03-17 DIAGNOSIS — Z800 Family history of malignant neoplasm of digestive organs: Secondary | ICD-10-CM | POA: Insufficient documentation

## 2017-03-17 DIAGNOSIS — Z8042 Family history of malignant neoplasm of prostate: Secondary | ICD-10-CM | POA: Insufficient documentation

## 2017-03-17 DIAGNOSIS — K219 Gastro-esophageal reflux disease without esophagitis: Secondary | ICD-10-CM | POA: Insufficient documentation

## 2017-03-17 DIAGNOSIS — M4800 Spinal stenosis, site unspecified: Secondary | ICD-10-CM | POA: Insufficient documentation

## 2017-03-17 DIAGNOSIS — Z9221 Personal history of antineoplastic chemotherapy: Secondary | ICD-10-CM | POA: Diagnosis not present

## 2017-03-17 DIAGNOSIS — Z9889 Other specified postprocedural states: Secondary | ICD-10-CM | POA: Diagnosis not present

## 2017-03-17 HISTORY — PX: INSERTION OF MESH: SHX5868

## 2017-03-17 HISTORY — PX: PARASTOMAL HERNIA REPAIR: SHX2162

## 2017-03-17 SURGERY — REPAIR, HERNIA, PARASTOMAL
Anesthesia: General | Site: Abdomen | Wound class: Clean Contaminated

## 2017-03-17 MED ORDER — MIDAZOLAM HCL 2 MG/2ML IJ SOLN
INTRAMUSCULAR | Status: AC
  Filled 2017-03-17: qty 2

## 2017-03-17 MED ORDER — GLYCOPYRROLATE 0.2 MG/ML IJ SOLN
INTRAMUSCULAR | Status: DC | PRN
  Administered 2017-03-17: 0.2 mg via INTRAVENOUS

## 2017-03-17 MED ORDER — FENTANYL CITRATE (PF) 100 MCG/2ML IJ SOLN
INTRAMUSCULAR | Status: AC
  Filled 2017-03-17: qty 2

## 2017-03-17 MED ORDER — ONDANSETRON HCL 4 MG/2ML IJ SOLN
INTRAMUSCULAR | Status: AC
  Filled 2017-03-17: qty 2

## 2017-03-17 MED ORDER — EPHEDRINE SULFATE 50 MG/ML IJ SOLN
INTRAMUSCULAR | Status: DC | PRN
  Administered 2017-03-17: 5 mg via INTRAVENOUS

## 2017-03-17 MED ORDER — ACETAMINOPHEN 650 MG RE SUPP: 650.0000 mg | Freq: Four times a day (QID) | RECTAL | Status: DC | PRN

## 2017-03-17 MED ORDER — VANCOMYCIN HCL IN DEXTROSE 1-5 GM/200ML-% IV SOLN
INTRAVENOUS | Status: AC
  Administered 2017-03-17: 1000 mg via INTRAVENOUS
  Filled 2017-03-17: qty 200

## 2017-03-17 MED ORDER — DEXAMETHASONE SODIUM PHOSPHATE 10 MG/ML IJ SOLN
INTRAMUSCULAR | Status: AC
  Filled 2017-03-17: qty 1

## 2017-03-17 MED ORDER — ROCURONIUM BROMIDE 50 MG/5ML IV SOLN
INTRAVENOUS | Status: AC
  Filled 2017-03-17: qty 1

## 2017-03-17 MED ORDER — SIMETHICONE 80 MG PO CHEW
80.0000 mg | CHEWABLE_TABLET | Freq: Four times a day (QID) | ORAL | Status: DC | PRN
  Administered 2017-03-18: 80 mg via ORAL
  Filled 2017-03-17 (×2): qty 1

## 2017-03-17 MED ORDER — LACTATED RINGERS IV SOLN
INTRAVENOUS | Status: DC
  Administered 2017-03-17 (×3): via INTRAVENOUS

## 2017-03-17 MED ORDER — MIDAZOLAM HCL 2 MG/2ML IJ SOLN
INTRAMUSCULAR | Status: DC | PRN
  Administered 2017-03-17: 2 mg via INTRAVENOUS

## 2017-03-17 MED ORDER — BUPIVACAINE-EPINEPHRINE (PF) 0.5% -1:200000 IJ SOLN
INTRAMUSCULAR | Status: AC
  Filled 2017-03-17: qty 30

## 2017-03-17 MED ORDER — SUGAMMADEX SODIUM 200 MG/2ML IV SOLN
INTRAVENOUS | Status: AC
  Filled 2017-03-17: qty 2

## 2017-03-17 MED ORDER — SODIUM BICARBONATE 650 MG PO TABS: 650.0000 mg | ORAL_TABLET | Freq: Every day | ORAL | Status: DC | PRN

## 2017-03-17 MED ORDER — HYDROCODONE-ACETAMINOPHEN 5-325 MG PO TABS: 1.0000 | ORAL_TABLET | ORAL | Status: DC | PRN

## 2017-03-17 MED ORDER — FAMOTIDINE 20 MG PO TABS
20.0000 mg | ORAL_TABLET | Freq: Once | ORAL | Status: AC
  Administered 2017-03-17: 20 mg via ORAL

## 2017-03-17 MED ORDER — LIDOCAINE HCL (CARDIAC) 20 MG/ML IV SOLN
INTRAVENOUS | Status: DC | PRN
  Administered 2017-03-17: 100 mg via INTRAVENOUS

## 2017-03-17 MED ORDER — LISINOPRIL-HYDROCHLOROTHIAZIDE 10-12.5 MG PO TABS: 1.0000 | ORAL_TABLET | Freq: Every day | ORAL | Status: DC

## 2017-03-17 MED ORDER — FENTANYL CITRATE (PF) 100 MCG/2ML IJ SOLN
INTRAMUSCULAR | Status: AC
  Administered 2017-03-17: 25 ug via INTRAVENOUS
  Filled 2017-03-17: qty 2

## 2017-03-17 MED ORDER — LISINOPRIL 10 MG PO TABS
10.0000 mg | ORAL_TABLET | Freq: Every day | ORAL | Status: DC
  Administered 2017-03-17 – 2017-03-19 (×3): 10 mg via ORAL
  Filled 2017-03-17 (×3): qty 1

## 2017-03-17 MED ORDER — ONDANSETRON HCL 4 MG/2ML IJ SOLN
4.0000 mg | Freq: Four times a day (QID) | INTRAMUSCULAR | Status: DC | PRN
  Administered 2017-03-17 – 2017-03-18 (×2): 4 mg via INTRAVENOUS
  Filled 2017-03-17 (×2): qty 2

## 2017-03-17 MED ORDER — MORPHINE SULFATE (PF) 2 MG/ML IV SOLN
1.0000 mg | INTRAVENOUS | Status: DC | PRN
  Administered 2017-03-17 – 2017-03-18 (×2): 2 mg via INTRAVENOUS
  Filled 2017-03-17 (×2): qty 1

## 2017-03-17 MED ORDER — HYDROCHLOROTHIAZIDE 12.5 MG PO CAPS
12.5000 mg | ORAL_CAPSULE | Freq: Every day | ORAL | Status: DC
  Administered 2017-03-17 – 2017-03-19 (×3): 12.5 mg via ORAL
  Filled 2017-03-17 (×3): qty 1

## 2017-03-17 MED ORDER — ROCURONIUM BROMIDE 100 MG/10ML IV SOLN
INTRAVENOUS | Status: DC | PRN
  Administered 2017-03-17: 30 mg via INTRAVENOUS
  Administered 2017-03-17: 50 mg via INTRAVENOUS

## 2017-03-17 MED ORDER — SUGAMMADEX SODIUM 200 MG/2ML IV SOLN
INTRAVENOUS | Status: DC | PRN
  Administered 2017-03-17: 160 mg via INTRAVENOUS

## 2017-03-17 MED ORDER — FENTANYL CITRATE (PF) 100 MCG/2ML IJ SOLN
INTRAMUSCULAR | Status: DC | PRN
  Administered 2017-03-17 (×3): 50 ug via INTRAVENOUS

## 2017-03-17 MED ORDER — ONDANSETRON 4 MG PO TBDP
4.0000 mg | ORAL_TABLET | Freq: Four times a day (QID) | ORAL | Status: DC | PRN
Start: 2017-03-17 — End: 2017-03-19

## 2017-03-17 MED ORDER — BUPIVACAINE-EPINEPHRINE 0.5% -1:200000 IJ SOLN
INTRAMUSCULAR | Status: DC | PRN
  Administered 2017-03-17: 10 mL

## 2017-03-17 MED ORDER — ACETAMINOPHEN 325 MG PO TABS
650.0000 mg | ORAL_TABLET | Freq: Four times a day (QID) | ORAL | Status: DC | PRN
  Administered 2017-03-18: 325 mg via ORAL
  Administered 2017-03-19: 650 mg via ORAL
  Filled 2017-03-17 (×2): qty 2

## 2017-03-17 MED ORDER — FAMOTIDINE 20 MG PO TABS
ORAL_TABLET | ORAL | Status: AC
  Administered 2017-03-17: 20 mg via ORAL
  Filled 2017-03-17: qty 1

## 2017-03-17 MED ORDER — DEXTROSE-NACL 5-0.2 % IV SOLN
INTRAVENOUS | Status: DC
  Administered 2017-03-17: 19:00:00 via INTRAVENOUS

## 2017-03-17 MED ORDER — LIDOCAINE HCL (PF) 2 % IJ SOLN
INTRAMUSCULAR | Status: AC
  Filled 2017-03-17: qty 10

## 2017-03-17 MED ORDER — FENTANYL CITRATE (PF) 100 MCG/2ML IJ SOLN
25.0000 ug | INTRAMUSCULAR | Status: DC | PRN
  Administered 2017-03-17 (×4): 25 ug via INTRAVENOUS

## 2017-03-17 MED ORDER — ONDANSETRON HCL 4 MG/2ML IJ SOLN: 4.0000 mg | Freq: Once | INTRAMUSCULAR | Status: DC | PRN

## 2017-03-17 MED ORDER — PROPOFOL 10 MG/ML IV BOLUS
INTRAVENOUS | Status: DC | PRN
  Administered 2017-03-17: 140 mg via INTRAVENOUS

## 2017-03-17 SURGICAL SUPPLY — 29 items
BLADE SURG 15 STRL LF DISP TIS (BLADE) ×1 IMPLANT
BLADE SURG 15 STRL SS (BLADE) ×1
CANISTER SUCT 1200ML W/VALVE (MISCELLANEOUS) ×2 IMPLANT
CHLORAPREP W/TINT 26ML (MISCELLANEOUS) ×2 IMPLANT
DERMABOND ADVANCED (GAUZE/BANDAGES/DRESSINGS) ×1
DERMABOND ADVANCED .7 DNX12 (GAUZE/BANDAGES/DRESSINGS) ×1 IMPLANT
DRAPE LAPAROTOMY 100X77 ABD (DRAPES) ×2 IMPLANT
ELECT REM PT RETURN 9FT ADLT (ELECTROSURGICAL) ×2
ELECTRODE REM PT RTRN 9FT ADLT (ELECTROSURGICAL) ×1 IMPLANT
GAUZE SPONGE 4X4 12PLY STRL (GAUZE/BANDAGES/DRESSINGS) IMPLANT
GLOVE BIO SURGEON STRL SZ7.5 (GLOVE) ×6 IMPLANT
GOWN STRL REUS W/ TWL LRG LVL3 (GOWN DISPOSABLE) ×3 IMPLANT
GOWN STRL REUS W/TWL LRG LVL3 (GOWN DISPOSABLE) ×3
KIT TURNOVER KIT A (KITS) ×2 IMPLANT
LABEL OR SOLS (LABEL) ×2 IMPLANT
MESH SYNTHETIC 4X6 SOFT BARD (Mesh General) ×1 IMPLANT
MESH SYNTHETIC SOFT BARD 4X6 (Mesh General) ×1 IMPLANT
NEEDLE HYPO 25X1 1.5 SAFETY (NEEDLE) ×2 IMPLANT
NS IRRIG 500ML POUR BTL (IV SOLUTION) ×2 IMPLANT
PACK BASIN MINOR ARMC (MISCELLANEOUS) ×2 IMPLANT
SPONGE XRAY 4X4 16PLY STRL (MISCELLANEOUS) ×2 IMPLANT
STAPLER SKIN PROX 35W (STAPLE) IMPLANT
SUT CHROMIC 3 0 SH 27 (SUTURE) ×4 IMPLANT
SUT MAXON 0 T 12 3 (SUTURE) ×2 IMPLANT
SUT MNCRL 4-0 (SUTURE) ×1
SUT MNCRL 4-0 27XMFL (SUTURE) ×1
SUT SURGILON 0 30 BLK (SUTURE) ×8 IMPLANT
SUTURE MNCRL 4-0 27XMF (SUTURE) ×1 IMPLANT
SYR 10ML LL (SYRINGE) ×2 IMPLANT

## 2017-03-17 NOTE — Anesthesia Preprocedure Evaluation (Signed)
Anesthesia Evaluation  Patient identified by MRN, date of birth, ID band Patient awake    Reviewed: Allergy & Precautions, NPO status , Patient's Chart, lab work & pertinent test results, reviewed documented beta blocker date and time   Airway Mallampati: II  TM Distance: >3 FB     Dental  (+) Chipped   Pulmonary           Cardiovascular hypertension, Pt. on medications + CAD       Neuro/Psych  Neuromuscular disease    GI/Hepatic hiatal hernia, GERD  Controlled,  Endo/Other    Renal/GU      Musculoskeletal  (+) Arthritis ,   Abdominal   Peds  Hematology   Anesthesia Other Findings   Reproductive/Obstetrics                             Anesthesia Physical Anesthesia Plan  ASA: III  Anesthesia Plan: General   Post-op Pain Management:    Induction: Intravenous  PONV Risk Score and Plan:   Airway Management Planned: Oral ETT and LMA  Additional Equipment:   Intra-op Plan:   Post-operative Plan:   Informed Consent: I have reviewed the patients History and Physical, chart, labs and discussed the procedure including the risks, benefits and alternatives for the proposed anesthesia with the patient or authorized representative who has indicated his/her understanding and acceptance.     Plan Discussed with: CRNA  Anesthesia Plan Comments:         Anesthesia Quick Evaluation

## 2017-03-17 NOTE — Op Note (Signed)
OPERATIVE REPORT  PREOPERATIVE  DIAGNOSIS: .  Paracolostomy hernia  POSTOPERATIVE DIAGNOSIS: .  Paracolostomy hernia  PROCEDURE: .  Paracolostomy hernia repair  ANESTHESIA:  General  SURGEON: Rochel Brome  MD  ASSISTANT SURGEON: Herbert Pun   INDICATIONS: .  He has a history of abdominal perineal resection and permanent end colostomy in the left lower quadrant.  He has a history of bulging surrounding the stoma and a large hernia was demonstrated on physical exam and repair was recommended for definitive treatment  With the patient on the operating table in the supine position he was placed under general endotracheal anesthesia.  The colostomy bag was removed.  There were several areas of granulation tissue adjacent to the mucosal cutaneous junction.  Some hair in the immediate area was removed with clippers.  The skin surrounding the stoma was prepared with Betadine solution.  After this the operative site was further prepared with ChloraPrep.  The skin above and lateral to the stoma was treated with benzoin.  A 1 inch sticky border steri drape was applied to isolate the stoma from the site of incision.  The site was draped with sterile towels and sheets.  A curvilinear incision was made approximately 6 cm from the stoma and approximately 10 cm in length.  Dissection was carried down through subcutaneous tissues using electrocautery for hemostasis.  The hernia sac was encountered.  The sac was dissected free from surrounding tissues.  Multiple small bleeding points were cauterized during the course of dissection.  The sac was dissected free from the fascial ring defect.  The defect was approximately 5 cm in dimension.  The colon was identified where it passed through the muscle wall.  Peritoneum and properitoneal fat were dissected away from the fascia and muscle on both sides of the defect.  Bard soft mesh was cut to create a oval shape of 5 x 6 cm.  This was placed into the  properitoneal plane and sutured to the overlying fascia with through and through 0 Maxon sutures.  Next the fascia was closed over the mesh with the first stitch placed adjacent to the colon narrowing defect but still leaving enough room for the colon.  Each stitch was also incorporated into the underlying mesh.  A 3-0 chromic suture was also placed in the fatty tissue adjacent to the colon and attached to the fascia.  Hemostasis was intact.  The Scarpa's fascia was closed with interrupted 3-0 chromic.  The skin was closed with running 4-0 Monocryl subcuticular suture and Dermabond.  As the Dermabond was drying the drapes and sheets were removed and the stump was further examined.  The few sites of granulation tissue were treated with electrocautery.  The skin was treated with benzoin and a new colostomy wafer and bag were applied.  This reached to within a few millimeters of the glue.  The patient tolerated surgery satisfactorily and was then prepared for transfer to the recovery room  Rochel Brome MD

## 2017-03-17 NOTE — H&P (Signed)
  He comes for pericolostomy hernia repair. He reports no change in condition since office exam.  Labs noted.  Hernia again noted on preop exam.. Discussed plan for surgery.

## 2017-03-17 NOTE — Transfer of Care (Signed)
Immediate Anesthesia Transfer of Care Note  Patient: Jeffrey Roberson  Procedure(s) Performed: HERNIA REPAIR PARASTOMAL (N/A Abdomen) INSERTION OF MESH (N/A Abdomen)  Patient Location: PACU  Anesthesia Type:General  Level of Consciousness: drowsy and patient cooperative  Airway & Oxygen Therapy: Patient Spontanous Breathing and Patient connected to face mask oxygen  Post-op Assessment: Report given to RN and Post -op Vital signs reviewed and stable  Post vital signs: Reviewed and stable  Last Vitals:  Vitals:   03/17/17 1159 03/17/17 1559  BP: (!) 143/84 (!) (P) 146/91  Pulse:  (P) 98  Resp:  (P) 16  Temp:  (P) 36.5 C  SpO2:  (P) 100%    Last Pain:  Vitals:   03/17/17 1149  TempSrc: Temporal         Complications: No apparent anesthesia complications

## 2017-03-17 NOTE — Anesthesia Procedure Notes (Signed)
Procedure Name: Intubation Date/Time: 03/17/2017 1:31 PM Performed by: Silvana Newness, CRNA Pre-anesthesia Checklist: Patient identified, Emergency Drugs available, Suction available, Patient being monitored and Timeout performed Patient Re-evaluated:Patient Re-evaluated prior to induction Oxygen Delivery Method: Circle system utilized Preoxygenation: Pre-oxygenation with 100% oxygen Induction Type: IV induction Ventilation: Mask ventilation without difficulty Laryngoscope Size: McGraph and 4 Grade View: Grade I Tube type: Oral Tube size: 7.5 mm Number of attempts: 1 Airway Equipment and Method: Stylet Placement Confirmation: ETT inserted through vocal cords under direct vision,  positive ETCO2 and breath sounds checked- equal and bilateral Secured at: 22 cm Tube secured with: Tape Dental Injury: Teeth and Oropharynx as per pre-operative assessment  Comments: Patient had chipped teeth noted in pre op.

## 2017-03-17 NOTE — Anesthesia Post-op Follow-up Note (Signed)
Anesthesia QCDR form completed.        

## 2017-03-18 DIAGNOSIS — K435 Parastomal hernia without obstruction or  gangrene: Secondary | ICD-10-CM | POA: Diagnosis not present

## 2017-03-18 MED ORDER — IBUPROFEN 400 MG PO TABS
400.0000 mg | ORAL_TABLET | Freq: Three times a day (TID) | ORAL | Status: DC | PRN
  Administered 2017-03-18 – 2017-03-19 (×3): 400 mg via ORAL
  Filled 2017-03-18 (×3): qty 1

## 2017-03-18 NOTE — Progress Notes (Signed)
He reports mild to moderate incisional pain 1 day after paracolostomy hernia repair.  He has walked only a few steps in the room.  He has been taking his diet satisfactorily.  IV fluids have been discontinued.  He is passing gas per colostomy.  He is voiding satisfactorily.  He had some adverse reaction to morphine and is now just taking Tyenol.  Vital signs are stable.  He is awake alert and oriented.  His incision is intact with no swelling.  Minimal tenderness.  The stoma appears typical.  I had him get up to walk in the room.  He is unsteady on his feet and reaching for something for balance .  He is not walking well enough to go home today.  I discussed this with his nurse and requested a walker to use today and tomorrow.  Work on walking before discharge. Try to walk some each waking hour.  Add ibuprofen to take in addition to Tylenol if needed for pain.

## 2017-03-18 NOTE — Anesthesia Postprocedure Evaluation (Signed)
Anesthesia Post Note  Patient: Jeffrey Roberson  Procedure(s) Performed: HERNIA REPAIR PARASTOMAL (N/A Abdomen) INSERTION OF MESH (N/A Abdomen)  Patient location during evaluation: Endoscopy Anesthesia Type: General Level of consciousness: awake and alert Pain management: pain level controlled Vital Signs Assessment: post-procedure vital signs reviewed and stable Respiratory status: spontaneous breathing, nonlabored ventilation, respiratory function stable and patient connected to nasal cannula oxygen Cardiovascular status: blood pressure returned to baseline and stable Postop Assessment: no apparent nausea or vomiting Anesthetic complications: no     Last Vitals:  Vitals:   03/17/17 2043 03/18/17 0454  BP: 129/73 111/66  Pulse: 94 90  Resp: 18 16  Temp: 36.7 C 37.2 C  SpO2: 100% 99%    Last Pain:  Vitals:   03/18/17 0454  TempSrc: Oral  PainSc:                  [name] S

## 2017-03-19 DIAGNOSIS — K435 Parastomal hernia without obstruction or  gangrene: Secondary | ICD-10-CM | POA: Diagnosis not present

## 2017-03-19 NOTE — Discharge Summary (Signed)
Physician Discharge Summary  Patient ID: Jeffrey Roberson MRN: [id number] DOB/AGE: January 31, 1951 67 y.o.  Admit date: 03/17/2017 Discharge date: 03/19/2017  Admission Diagnoses:  Discharge Diagnoses:  Active Problems:   Ventral hernia   Discharged Condition: good  Hospital Course: Patient underwent parastomal hernia repair with mesh. Tolerated procedure well. Post op day #1 patient was weak and had difficulty walking. Today patient with better pain, eating better and stronger. Discharged being able to walk, tolerate diet, passing gas per ostomy and pain controlled.    Consults: None  Significant Diagnostic Studies: None   Treatments: analgesia: acetaminophen and ibuprofen   Discharge Exam: Blood pressure 109/70, pulse 79, temperature 97.7 F (36.5 C), temperature source Oral, resp. rate 16, height 5\' 9"  (1.753 m), weight 82.8 kg (182 lb 8.7 oz), SpO2 99 %. General appearance: alert and cooperative GI: soft, non-tender; bowel sounds normal; no masses,  no organomegaly Incision/Wound:clean, dry, no erythema  Disposition: 01-Home or Self Care  Discharge Instructions    Diet - low sodium heart healthy   Complete by:  As directed    Increase activity slowly   Complete by:  As directed      Allergies as of 03/19/2017      Reactions   Aloxi [palonosetron Hcl] Other (See Comments)   Unknown reaction   Decadron [dexamethasone] Other (See Comments)   Unknown reaction   Emend [aprepitant] Other (See Comments)   Burning sensation from legs to head   Hydrocodone-acetaminophen Other (See Comments)   Unknown reaction   Penicillin V Potassium Other (See Comments)   Edema Has patient had a PCN reaction causing immediate rash, facial/tongue/throat swelling, SOB or lightheadedness with hypotension: Yes Has patient had a PCN reaction causing severe rash involving mucus membranes or skin necrosis: Unknown Has patient had a PCN reaction that required hospitalization: Unknown Has  patient had a PCN reaction occurring within the last 10 years: No If all of the above answers are "NO", then may proceed with Cephalosporin use.      Medication List    TAKE these medications   lisinopril-hydrochlorothiazide 10-12.5 MG tablet Commonly known as:  PRINZIDE,ZESTORETIC Take 1 tablet by mouth once daily   simethicone 80 MG chewable tablet Commonly known as:  MYLICON Chew 80 mg by mouth every 6 (six) hours as needed for flatulence.   sodium bicarbonate 650 MG tablet Take 650 mg by mouth daily as needed for heartburn.        Signed: Herbert Pun 03/19/2017, 11:33 AM

## 2017-03-19 NOTE — Final Progress Note (Signed)
[facility] Hospital Day(s): 0.   Post op day(s): 2 Days Post-Op.   Interval History: Patient seen and examined, no acute events or new complaints overnight. Patient reports feeling stronger today. Patient refers he walked last night and today during the morning with the walker. He refers that if he is at home eating better he will feel even stronger and more confident walking. Denies nausea and vomiting. Pain controlled with ibuprofen.   Vital signs in last 24 hours: [min-max] current  Temp:  [97.7 F (36.5 C)-97.9 F (36.6 C)] 97.7 F (36.5 C) (02/10 0605) Pulse Rate:  [72-80] 79 (02/10 0942) Resp:  [16-20] 16 (02/10 0605) BP: (109-122)/(65-73) 109/70 (02/10 0942) SpO2:  [99 %-100 %] 99 % (02/10 0605)     Height: 5\' 9"  (175.3 cm) Weight: 82.8 kg (182 lb 8.7 oz) BMI (Calculated): 26.94    Physical Exam:  Constitutional: alert, cooperative and no distress  Respiratory: breathing non-labored at rest  Cardiovascular: regular rate and sinus rhythm  Gastrointestinal: soft, non-tender, and non-distended. Wound dry and clean. Ostomy pink and patent. Abundant air on bag. No periostomal bulging.   Labs:  CBC Latest Ref Rng & Units 03/07/2017 10/28/2016 09/13/2016  WBC 3.8 - 10.6 K/uL 6.6 6.9 11.5(H)  Hemoglobin 13.0 - 18.0 g/dL 15.8 15.4 15.5  Hematocrit 40.0 - 52.0 % 47.0 44.3 45.4  Platelets 150 - 440 K/uL 204 246 194   CMP Latest Ref Rng & Units 03/07/2017 10/28/2016 09/13/2016  Glucose 65 - 99 mg/dL 88 100(H) 132(H)  BUN 6 - 20 mg/dL 20 23(H) 15  Creatinine 0.61 - 1.24 mg/dL 0.92 0.99 0.92  Sodium 135 - 145 mmol/L 135 135 137  Potassium 3.5 - 5.1 mmol/L 3.8 3.6 3.3(L)  Chloride 101 - 111 mmol/L 101 101 102  CO2 22 - 32 mmol/L 26 25 24   Calcium 8.9 - 10.3 mg/dL 9.2 9.2 9.0  Total Protein 6.5 - 8.1 g/dL 7.2 7.1 6.8  Total Bilirubin 0.3 - 1.2 mg/dL 0.6 0.5 0.9  Alkaline Phos 38 - 126 U/L 57 48 45  AST 15 - 41 U/L 19 18 22   ALT 17 - 63 U/L 17 19 14(L)   Imaging  studies: No new pertinent imaging studies   Assessment/Plan:  67 y.o. male status post periostomal hernia repair with mesh. Yesterday was weak but today patient is stronger and able to ambulate without difficulty. Pain controlled with ibuprofen. Will discharge home. Patient oriented how to take care of wound, may eat regular diet, and need to avoid heavy lifting. Will be followed by Dr. Tamala Julian in 2 weeks at the clinic.   All of the above findings and recommendations were discussed with the patient, and the medical team, and all of patient's questions were answered to his expressed satisfaction.  Arnold Long, MD

## 2017-03-19 NOTE — Progress Notes (Signed)
Pt was given D/C instructions and stated he understood and would comply. I removed his IV without incident and he was D/C to his wife.

## 2017-03-19 NOTE — Discharge Instructions (Signed)
°  Diet: Resume home heart healthy regular diet.   Activity: No heavy lifting > 10 pounds (children, pets, laundry, garbage) or strenuous activity until follow-up, but light activity and walking are encouraged. Do not drive or drink alcohol if taking narcotic pain medications.  Wound care: May shower with soapy water and pat dry (do not rub incisions), but no baths or submerging incision underwater until follow-up. (no swimming)   Medications: Resume all home medications. For pain: acetaminophen (Tylenol) or ibuprofen. Combining Tylenol with alcohol can substantially increase your risk of causing liver disease.   Call office [phone]) at any time if any questions, worsening pain, fevers/chills, bleeding, drainage from incision site, or other concerns.

## 2017-03-20 ENCOUNTER — Encounter: Payer: Self-pay | Admitting: Surgery

## 2017-07-26 ENCOUNTER — Other Ambulatory Visit: Payer: Self-pay

## 2017-07-26 ENCOUNTER — Inpatient Hospital Stay (HOSPITAL_BASED_OUTPATIENT_CLINIC_OR_DEPARTMENT_OTHER): Payer: Medicare Other | Admitting: Oncology

## 2017-07-26 ENCOUNTER — Encounter: Payer: Self-pay | Admitting: Oncology

## 2017-07-26 ENCOUNTER — Inpatient Hospital Stay: Payer: Medicare Other | Attending: Oncology

## 2017-07-26 VITALS — BP 121/78 | HR 84 | Temp 97.3°F | Resp 18 | Wt 179.4 lb

## 2017-07-26 DIAGNOSIS — G629 Polyneuropathy, unspecified: Secondary | ICD-10-CM | POA: Diagnosis not present

## 2017-07-26 DIAGNOSIS — G620 Drug-induced polyneuropathy: Secondary | ICD-10-CM | POA: Diagnosis not present

## 2017-07-26 DIAGNOSIS — C20 Malignant neoplasm of rectum: Secondary | ICD-10-CM | POA: Diagnosis not present

## 2017-07-26 DIAGNOSIS — Z85048 Personal history of other malignant neoplasm of rectum, rectosigmoid junction, and anus: Secondary | ICD-10-CM | POA: Diagnosis not present

## 2017-07-26 LAB — COMPREHENSIVE METABOLIC PANEL
ALT: 16 U/L — ABNORMAL LOW (ref 17–63)
AST: 19 U/L (ref 15–41)
Albumin: 4.1 g/dL (ref 3.5–5.0)
Alkaline Phosphatase: 51 U/L (ref 38–126)
Anion gap: 8 (ref 5–15)
BUN: 18 mg/dL (ref 6–20)
CHLORIDE: 104 mmol/L (ref 101–111)
CO2: 25 mmol/L (ref 22–32)
CREATININE: 0.96 mg/dL (ref 0.61–1.24)
Calcium: 9.1 mg/dL (ref 8.9–10.3)
Glucose, Bld: 100 mg/dL — ABNORMAL HIGH (ref 65–99)
POTASSIUM: 4 mmol/L (ref 3.5–5.1)
Sodium: 137 mmol/L (ref 135–145)
TOTAL PROTEIN: 6.8 g/dL (ref 6.5–8.1)
Total Bilirubin: 0.6 mg/dL (ref 0.3–1.2)

## 2017-07-26 LAB — CBC WITH DIFFERENTIAL/PLATELET
BASOS ABS: 0.1 10*3/uL (ref 0–0.1)
Basophils Relative: 1 %
EOS ABS: 0.1 10*3/uL (ref 0–0.7)
Eosinophils Relative: 2 %
HCT: 46.8 % (ref 40.0–52.0)
HEMOGLOBIN: 16.1 g/dL (ref 13.0–18.0)
LYMPHS ABS: 0.8 10*3/uL — AB (ref 1.0–3.6)
LYMPHS PCT: 15 %
MCH: 30 pg (ref 26.0–34.0)
MCHC: 34.4 g/dL (ref 32.0–36.0)
MCV: 87.2 fL (ref 80.0–100.0)
Monocytes Absolute: 0.6 10*3/uL (ref 0.2–1.0)
Monocytes Relative: 11 %
NEUTROS PCT: 71 %
Neutro Abs: 3.9 10*3/uL (ref 1.4–6.5)
Platelets: 201 10*3/uL (ref 150–440)
RBC: 5.37 MIL/uL (ref 4.40–5.90)
RDW: 14.4 % (ref 11.5–14.5)
WBC: 5.5 10*3/uL (ref 3.8–10.6)

## 2017-07-26 NOTE — Progress Notes (Signed)
Patient here for follow up. "Had surgery for hernia repair about 4 months ago".

## 2017-07-26 NOTE — Progress Notes (Signed)
[facility] OFFICE PROGRESS NOTE  Patient Care Team: Idelle Crouch, MD as PCP - General (Internal Medicine)  Cancer Staging Rectal cancer [facility]) Staging form: Colon and Rectum, AJCC 7th Edition - Clinical stage from 06/15/2009: Stage IIA (T3, N0, M0) - Signed by Evlyn Kanner, NP on 09/16/2014    Oncology History   # 2010- rectal cancer T3 N0 M0, clinically stage II. At that time he also had a small but nondiagnostic pelvic node, negative by PET scan. Status post treatment with concurrent chemotherapy and radiation. Patient completed short course of Xeloda with radiation, then abdominoperineal resection in 2010 with FOLFOX starting in April 2011 [Dr.Smith; Dr.Gittin]  # FEB 2018- CT A/P- chronic presacral changes; parastomal hernia. DEC 2018- PET-NED     Rectal cancer ([place])     INTERVAL HISTORY:  Sukhdeep Wieting Wessells 67 y.o.  male pleasant patient above history presents for follow-up. Patient.  Patient oncology history was reviewed.  Has stage II rectal cancer, status post concurrent chemoradiation followed by APR in 2010, and adjuvant chemotherapy.  #Chronic abdominal discomfort: Which is now resolved after his recent hernia repair. #Rectal cancer, PET scan 6 months ago showed no evidence of metastatic disease. Good appetite.  No bloody ostomy bag. 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  Stable   Review of Systems  Constitutional: Negative for chills, fever, malaise/fatigue and weight loss.  HENT: Negative for congestion, ear discharge, ear pain, nosebleeds, sinus pain and sore throat.   Eyes: Negative for double vision, photophobia, pain, discharge and redness.  Respiratory: Negative for cough, hemoptysis, sputum production, shortness of breath and wheezing.   Cardiovascular: Negative for chest pain, palpitations, orthopnea, claudication and leg swelling.  Gastrointestinal: Negative for abdominal pain, blood in stool,  constipation, diarrhea, heartburn, melena, nausea and vomiting.  Genitourinary: Negative for dysuria, flank pain, frequency and hematuria.  Musculoskeletal: Negative for back pain, myalgias and neck pain.  Skin: Negative for itching and rash.  Neurological: Negative for dizziness, tingling, tremors, focal weakness, weakness and headaches.  Endo/Heme/Allergies: Negative for environmental allergies. Does not bruise/bleed easily.  Psychiatric/Behavioral: Negative for depression and hallucinations. The patient is not nervous/anxious.   :   PAST MEDICAL HISTORY :  Past Medical History:  Diagnosis Date  . Cancer [facility])    Rectal   . Coronary artery disease   . DDD (degenerative disc disease), cervical   . Diverticulosis   . Fundic gland polyps of stomach, benign   . GERD (gastroesophageal reflux disease)   . Hemorrhage of rectum and anus   . History of hiatal hernia   . Hypertension   . Pneumothorax, spontaneous, tension   . Rectal cancer ([place]) 09/11/2014   colon    PAST SURGICAL HISTORY :   Past Surgical History:  Procedure Laterality Date  . ABDOMINOPERINEAL PROCTOCOLECTOMY  10/18/11  . COLON SURGERY    . COLONOSCOPY N/A 12/23/2014   Procedure: COLONOSCOPY;  Surgeon: Lollie Sails, MD;  Location: [facility] ENDOSCOPY;  Service: Endoscopy;  Laterality: N/A;  . Colostomy Placement    . INSERTION OF MESH N/A 03/17/2017   Procedure: INSERTION OF MESH;  Surgeon: Leonie Green, MD;  Location: [facility] ORS;  Service: General;  Laterality: N/A;  . PARASTOMAL HERNIA REPAIR N/A 03/17/2017   Procedure: HERNIA REPAIR PARASTOMAL;  Surgeon: Leonie Green, MD;  Location: [facility] ORS;  Service: General;  Laterality: N/A;    FAMILY HISTORY :   Family History  Problem Relation Age of Onset  .  Colon cancer Father     SOCIAL HISTORY:   Social History   Tobacco Use  . Smoking status: Never Smoker  . Smokeless tobacco: Never Used  Substance Use Topics  . Alcohol use: No  . Drug use: No     ALLERGIES:  is allergic to aloxi [palonosetron hcl]; decadron [dexamethasone]; emend [aprepitant]; hydrocodone-acetaminophen; and penicillin v potassium.  MEDICATIONS:  Current Outpatient Medications  Medication Sig Dispense Refill  . lisinopril-hydrochlorothiazide (PRINZIDE,ZESTORETIC) 10-12.5 MG per tablet Take 1 tablet by mouth once daily    . simethicone (MYLICON) 80 MG chewable tablet Chew 80 mg by mouth every 6 (six) hours as needed for flatulence.     . sodium bicarbonate 650 MG tablet Take 650 mg by mouth daily as needed for heartburn.     No current facility-administered medications for this visit.     PHYSICAL EXAMINATION: ECOG PERFORMANCE STATUS: 0 - Asymptomatic  BP 121/78 (BP Location: Left Arm, Patient Position: Sitting)   Pulse 84   Temp (!) 97.3 F (36.3 C) (Tympanic)   Resp 18   Wt 179 lb 6.4 oz (81.4 kg)   BMI 26.49 kg/m   Filed Weights   07/26/17 1127  Weight: 179 lb 6.4 oz (81.4 kg)   Physical Exam  Constitutional: He is oriented to person, place, and time and well-developed, well-nourished, and in no distress. No distress.  HENT:  Head: Normocephalic and atraumatic.  Nose: Nose normal.  Mouth/Throat: Oropharynx is clear and moist. No oropharyngeal exudate.  Eyes: Pupils are equal, round, and reactive to light. EOM are normal. Left eye exhibits no discharge. No scleral icterus.  Neck: Normal range of motion. Neck supple. No JVD present.  Cardiovascular: Normal rate, regular rhythm and normal heart sounds.  No murmur heard. Pulmonary/Chest: Effort normal and breath sounds normal. No respiratory distress. He has no wheezes. He has no rales. He exhibits no tenderness.  Abdominal: Soft. He exhibits no distension and no mass. There is no tenderness. There is no rebound.  +Colostomy  Musculoskeletal: Normal range of motion. He exhibits no edema or tenderness.  Lymphadenopathy:    He has no cervical adenopathy.  Neurological: He is alert and oriented to  person, place, and time. No cranial nerve deficit. He exhibits normal muscle tone. Coordination normal.  Skin: Skin is warm and dry. No rash noted. He is not diaphoretic. No erythema.  Psychiatric: Affect and judgment normal.   We will follow-up with LABORATORY DATA:  I have reviewed the data as listed    Component Value Date/Time   NA 137 07/26/2017 1057   K 4.0 07/26/2017 1057   CL 104 07/26/2017 1057   CO2 25 07/26/2017 1057   GLUCOSE 100 (H) 07/26/2017 1057   BUN 18 07/26/2017 1057   CREATININE 0.96 07/26/2017 1057   CREATININE 1.01 03/15/2012 0950   CALCIUM 9.1 07/26/2017 1057   PROT 6.8 07/26/2017 1057   PROT 7.2 11/10/2011 1010   ALBUMIN 4.1 07/26/2017 1057   ALBUMIN 3.8 11/10/2011 1010   AST 19 07/26/2017 1057   AST 17 11/10/2011 1010   ALT 16 (L) 07/26/2017 1057   ALT 22 11/10/2011 1010   ALKPHOS 51 07/26/2017 1057   ALKPHOS 69 11/10/2011 1010   BILITOT 0.6 07/26/2017 1057   BILITOT 0.5 11/10/2011 1010   GFRNONAA >60 07/26/2017 1057   GFRNONAA >60 03/15/2012 0950   GFRAA >60 07/26/2017 1057   GFRAA >60 03/15/2012 0950    No results found for: SPEP, UPEP  Lab Results  Component Value Date   WBC 5.5 07/26/2017   NEUTROABS 3.9 07/26/2017   HGB 16.1 07/26/2017   HCT 46.8 07/26/2017   MCV 87.2 07/26/2017   PLT 201 07/26/2017      Chemistry      Component Value Date/Time   NA 137 07/26/2017 1057   K 4.0 07/26/2017 1057   CL 104 07/26/2017 1057   CO2 25 07/26/2017 1057   BUN 18 07/26/2017 1057   CREATININE 0.96 07/26/2017 1057   CREATININE 1.01 03/15/2012 0950      Component Value Date/Time   CALCIUM 9.1 07/26/2017 1057   ALKPHOS 51 07/26/2017 1057   ALKPHOS 69 11/10/2011 1010   AST 19 07/26/2017 1057   AST 17 11/10/2011 1010   ALT 16 (L) 07/26/2017 1057   ALT 22 11/10/2011 1010   BILITOT 0.6 07/26/2017 1057   BILITOT 0.5 11/10/2011 1010       RADIOGRAPHIC STUDIES: I have personally reviewed the radiological images as listed and agreed with  the findings in the report. Clinical impression no evidence of local rectal carcinoma recurrence in the pelvis.  No evidence of metastatic.  ASSESSMENT & PLAN:  1. Rectal cancer ([place])   2. Neuropathy   In remission, no clinical evidence of disease recurrence. CEA pending. Labs reviewed and discussed with patient.. Chronic neuropathy, stable not taking any medications for neuropathy.  Follow-up with Dr. B in 6 months with labs.     Orders Placed This Encounter  Procedures  . CBC with Differential/Platelet    Standing Status:   Future    Standing Expiration Date:   07/27/2018  . Comprehensive metabolic panel    Standing Status:   Future    Standing Expiration Date:   07/27/2018  . CEA    Standing Status:   Future    Standing Expiration Date:   07/27/2018   All questions were answered. The patient knows to call the clinic with any problems, questions or concerns.      Earlie Server, MD 07/26/2017 9:56 PM

## 2017-07-27 LAB — CEA: CEA: 3.7 ng/mL (ref 0.0–4.7)

## 2018-01-25 ENCOUNTER — Inpatient Hospital Stay: Payer: Medicare Other | Attending: Internal Medicine

## 2018-01-25 ENCOUNTER — Inpatient Hospital Stay (HOSPITAL_BASED_OUTPATIENT_CLINIC_OR_DEPARTMENT_OTHER): Payer: Medicare Other | Admitting: Internal Medicine

## 2018-01-25 ENCOUNTER — Other Ambulatory Visit: Payer: Self-pay

## 2018-01-25 ENCOUNTER — Encounter: Payer: Self-pay | Admitting: Internal Medicine

## 2018-01-25 VITALS — BP 132/90 | HR 98 | Temp 97.9°F | Resp 16 | Wt 185.4 lb

## 2018-01-25 DIAGNOSIS — Z800 Family history of malignant neoplasm of digestive organs: Secondary | ICD-10-CM | POA: Diagnosis not present

## 2018-01-25 DIAGNOSIS — G629 Polyneuropathy, unspecified: Secondary | ICD-10-CM

## 2018-01-25 DIAGNOSIS — Z85048 Personal history of other malignant neoplasm of rectum, rectosigmoid junction, and anus: Secondary | ICD-10-CM | POA: Insufficient documentation

## 2018-01-25 DIAGNOSIS — R109 Unspecified abdominal pain: Secondary | ICD-10-CM | POA: Diagnosis not present

## 2018-01-25 DIAGNOSIS — C20 Malignant neoplasm of rectum: Secondary | ICD-10-CM

## 2018-01-25 DIAGNOSIS — Z9221 Personal history of antineoplastic chemotherapy: Secondary | ICD-10-CM | POA: Insufficient documentation

## 2018-01-25 DIAGNOSIS — Z923 Personal history of irradiation: Secondary | ICD-10-CM

## 2018-01-25 DIAGNOSIS — I10 Essential (primary) hypertension: Secondary | ICD-10-CM | POA: Diagnosis not present

## 2018-01-25 DIAGNOSIS — Z933 Colostomy status: Secondary | ICD-10-CM | POA: Insufficient documentation

## 2018-01-25 LAB — COMPREHENSIVE METABOLIC PANEL
ALBUMIN: 4.3 g/dL (ref 3.5–5.0)
ALK PHOS: 44 U/L (ref 38–126)
ALT: 20 U/L (ref 0–44)
AST: 19 U/L (ref 15–41)
Anion gap: 8 (ref 5–15)
BILIRUBIN TOTAL: 1 mg/dL (ref 0.3–1.2)
BUN: 17 mg/dL (ref 8–23)
CALCIUM: 9.3 mg/dL (ref 8.9–10.3)
CO2: 28 mmol/L (ref 22–32)
Chloride: 103 mmol/L (ref 98–111)
Creatinine, Ser: 0.96 mg/dL (ref 0.61–1.24)
GFR calc Af Amer: >60 mL/min (ref >60)
GFR calc non Af Amer: >60 mL/min (ref >60)
GLUCOSE: 102 mg/dL — AB (ref 70–99)
Potassium: 3.9 mmol/L (ref 3.5–5.1)
Sodium: 139 mmol/L (ref 135–145)
TOTAL PROTEIN: 7.2 g/dL (ref 6.5–8.1)

## 2018-01-25 LAB — CBC WITH DIFFERENTIAL/PLATELET
ABS IMMATURE GRANULOCYTES: 0.02 10*3/uL (ref 0.00–0.07)
BASOS PCT: 1 %
Basophils Absolute: 0 10*3/uL (ref 0.0–0.1)
EOS ABS: 0.1 10*3/uL (ref 0.0–0.5)
Eosinophils Relative: 1 %
HCT: 49.8 % (ref 39.0–52.0)
Hemoglobin: 16.3 g/dL (ref 13.0–17.0)
Immature Granulocytes: 0 %
Lymphocytes Relative: 14 %
Lymphs Abs: 0.8 10*3/uL (ref 0.7–4.0)
MCH: 28.7 pg (ref 26.0–34.0)
MCHC: 32.7 g/dL (ref 30.0–36.0)
MCV: 87.7 fL (ref 80.0–100.0)
MONOS PCT: 8 %
Monocytes Absolute: 0.4 10*3/uL (ref 0.1–1.0)
Neutro Abs: 4.2 10*3/uL (ref 1.7–7.7)
Neutrophils Relative %: 76 %
PLATELETS: 190 10*3/uL (ref 150–400)
RBC: 5.68 MIL/uL (ref 4.22–5.81)
RDW: 13.4 % (ref 11.5–15.5)
WBC: 5.6 10*3/uL (ref 4.0–10.5)
nRBC: 0 % (ref 0.0–0.2)

## 2018-01-25 NOTE — Assessment & Plan Note (Addendum)
#    Rectal cancer-DEC 2018- PET scan-no evidence of local recurrence or distant metastatic disease. Continue surveillaince colo in Jan 2020 [Dr.Skulskie]  # Abdominal discomfort- Likely secondary to parastomal hernia-status post surgery-currently improved.  No concerns for recurrence.  Surveillance as above.  # rectal cancer- father in 75s; personal rectal cancer-59. 2 sisters [younger]; no children. ? Mom- colon cancer?.  Will discuss genetic testing at next visit  # Peripheral Neuropathy- G-2; stable. Reluctant to try any medications; discussed re: acupuncture; not interested.   # DISPOSITION:  # follow up in 12 monmts/labs -cbc/cmp/CEA-Dr.B  Cc; Dr.Smith

## 2018-01-25 NOTE — Progress Notes (Signed)
[place] OFFICE PROGRESS NOTE  Patient Care Team: Idelle Crouch, MD as PCP - General (Internal Medicine)  Cancer Staging Rectal cancer [facility]) Staging form: Colon and Rectum, AJCC 7th Edition - Clinical stage from 06/15/2009: Stage IIA (T3, N0, M0) - Signed by Evlyn Kanner, NP on 09/16/2014    Oncology History   # 2010- rectal cancer T3 N0 M0, clinically stage II. At that time he also had a small but nondiagnostic pelvic node, negative by PET scan. Status post treatment with concurrent chemotherapy and radiation. Patient completed short course of Xeloda with radiation, then abdominoperineal resection in 2010 with FOLFOX starting in April 2011 [Dr.Smith; Dr.Gittin]  # FEB 2018- CT A/P- chronic presacral changes; parastomal hernia. DEC 2018- PET-NED  DIAGNOSIS: RECTAL CA  STAGE:   II      ;GOALS: cure  CURRENT/MOST RECENT THERAPY: surveillaince      Rectal cancer (HCC)     INTERVAL HISTORY:  Jeffrey Roberson 67 y.o.  male pleasant patient above history of Rectal cancer stage II-is here for follow-up.  In the interim patient had surgery for his parastomal hernia.  He noted a significant improvement of his left lower quadrant discomfort.  He continues to chronic tingling and numbness of his extremities.  Not any worse.   Review of Systems  Constitutional: Negative for chills, diaphoresis, fever, malaise/fatigue and weight loss.  HENT: Negative for nosebleeds and sore throat.   Eyes: Negative for double vision.  Respiratory: Negative for cough, hemoptysis, sputum production, shortness of breath and wheezing.   Cardiovascular: Negative for chest pain, palpitations, orthopnea and leg swelling.  Gastrointestinal: Negative for abdominal pain, blood in stool, constipation, diarrhea, heartburn, melena, nausea and vomiting.  Genitourinary: Negative for dysuria, frequency and urgency.  Musculoskeletal: Negative for back pain and joint pain.  Skin: Negative.   Negative for itching and rash.  Neurological: Positive for tingling. Negative for dizziness, focal weakness, weakness and headaches.  Endo/Heme/Allergies: Does not bruise/bleed easily.  Psychiatric/Behavioral: Negative for depression. The patient is not nervous/anxious and does not have insomnia.      PAST MEDICAL HISTORY :  Past Medical History:  Diagnosis Date  . Cancer [facility])    Rectal   . Coronary artery disease   . DDD (degenerative disc disease), cervical   . Diverticulosis   . Fundic gland polyps of stomach, benign   . GERD (gastroesophageal reflux disease)   . Hemorrhage of rectum and anus   . History of hiatal hernia   . Hypertension   . Pneumothorax, spontaneous, tension   . Rectal cancer ([place]) 09/11/2014   colon    PAST SURGICAL HISTORY :   Past Surgical History:  Procedure Laterality Date  . ABDOMINOPERINEAL PROCTOCOLECTOMY  10/18/11  . COLON SURGERY    . COLONOSCOPY N/A 12/23/2014   Procedure: COLONOSCOPY;  Surgeon: Lollie Sails, MD;  Location[facility] ENDOSCOPY;  Service: Endoscopy;  Laterality: N/A;  . Colostomy Placement    . INSERTION OF MESH N/A 03/17/2017   Procedure: INSERTION OF MESH;  Surgeon: Leonie Green, MD;  Location: [facility] ORS;  Service: General;  Laterality: N/A;  . PARASTOMAL HERNIA REPAIR N/A 03/17/2017   Procedure: HERNIA REPAIR PARASTOMAL;  Surgeon: Leonie Green, MD;  Location: [facility] ORS;  Service: General;  Laterality: N/A;    FAMILY HISTORY :   Family History  Problem Relation Age of Onset  . Colon cancer Father     SOCIAL HISTORY:   Social History   Tobacco  Use  . Smoking status: Never Smoker  . Smokeless tobacco: Never Used  Substance Use Topics  . Alcohol use: No  . Drug use: No    ALLERGIES:  is allergic to aloxi [palonosetron hcl]; decadron [dexamethasone]; emend [aprepitant]; hydrocodone-acetaminophen; and penicillin v potassium.  MEDICATIONS:  Current Outpatient Medications  Medication Sig Dispense Refill  .  lisinopril-hydrochlorothiazide (PRINZIDE,ZESTORETIC) 10-12.5 MG per tablet Take 1 tablet by mouth once daily    . simethicone (MYLICON) 80 MG chewable tablet Chew 80 mg by mouth every 6 (six) hours as needed for flatulence.     . sodium bicarbonate 650 MG tablet Take 650 mg by mouth daily as needed for heartburn.     No current facility-administered medications for this visit.     PHYSICAL EXAMINATION: ECOG PERFORMANCE STATUS: 0 - Asymptomatic  BP 132/90 (BP Location: Left Arm, Patient Position: Sitting)   Pulse 98   Temp 97.9 F (36.6 C) (Tympanic)   Resp 16   Wt 185 lb 6.4 oz (84.1 kg)   BMI 27.38 kg/m   Filed Weights   01/25/18 1134  Weight: 185 lb 6.4 oz (84.1 kg)    Physical Exam  Constitutional: He is oriented to person, place, and time and well-developed, well-nourished, and in no distress.  HENT:  Head: Normocephalic and atraumatic.  Mouth/Throat: Oropharynx is clear and moist. No oropharyngeal exudate.  Eyes: Pupils are equal, round, and reactive to light.  Neck: Normal range of motion. Neck supple.  Cardiovascular: Normal rate and regular rhythm.  Pulmonary/Chest: No respiratory distress. He has no wheezes.  Abdominal: Soft. Bowel sounds are normal. He exhibits no distension and no mass. There is no abdominal tenderness. There is no rebound and no guarding.  Colostomy bag noted.  Musculoskeletal: Normal range of motion.        General: No tenderness or edema.  Neurological: He is alert and oriented to person, place, and time.  Skin: Skin is warm.  Psychiatric: Affect normal.     LABORATORY DATA:  I have reviewed the data as listed    Component Value Date/Time   NA 139 01/25/2018 1041   K 3.9 01/25/2018 1041   CL 103 01/25/2018 1041   CO2 28 01/25/2018 1041   GLUCOSE 102 (H) 01/25/2018 1041   BUN 17 01/25/2018 1041   CREATININE 0.96 01/25/2018 1041   CREATININE 1.01 03/15/2012 0950   CALCIUM 9.3 01/25/2018 1041   PROT 7.2 01/25/2018 1041   PROT 7.2  11/10/2011 1010   ALBUMIN 4.3 01/25/2018 1041   ALBUMIN 3.8 11/10/2011 1010   AST 19 01/25/2018 1041   AST 17 11/10/2011 1010   ALT 20 01/25/2018 1041   ALT 22 11/10/2011 1010   ALKPHOS 44 01/25/2018 1041   ALKPHOS 69 11/10/2011 1010   BILITOT 1.0 01/25/2018 1041   BILITOT 0.5 11/10/2011 1010   GFRNONAA >60 01/25/2018 1041   GFRNONAA >60 03/15/2012 0950   GFRAA >60 01/25/2018 1041   GFRAA >60 03/15/2012 0950    No results found for: SPEP, UPEP  Lab Results  Component Value Date   WBC 5.6 01/25/2018   NEUTROABS 4.2 01/25/2018   HGB 16.3 01/25/2018   HCT 49.8 01/25/2018   MCV 87.7 01/25/2018   PLT 190 01/25/2018      Chemistry      Component Value Date/Time   NA 139 01/25/2018 1041   K 3.9 01/25/2018 1041   CL 103 01/25/2018 1041   CO2 28 01/25/2018 1041   BUN 17 01/25/2018 1041  CREATININE 0.96 01/25/2018 1041   CREATININE 1.01 03/15/2012 0950      Component Value Date/Time   CALCIUM 9.3 01/25/2018 1041   ALKPHOS 44 01/25/2018 1041   ALKPHOS 69 11/10/2011 1010   AST 19 01/25/2018 1041   AST 17 11/10/2011 1010   ALT 20 01/25/2018 1041   ALT 22 11/10/2011 1010   BILITOT 1.0 01/25/2018 1041   BILITOT 0.5 11/10/2011 1010       RADIOGRAPHIC STUDIES: I have personally reviewed the radiological images as listed and agreed with the findings in the report. No results found.  IMPRESSION: 1. No evidence of local rectal carcinoma recurrence the pelvis. 2. No evidence of metastatic disease. 3. Large peristomal hernia along the LEFT lower quadrant colostomy without evidence of obstruction.   Electronically Signed   By: Suzy Bouchard M.D.   On: 01/23/2017 13:14  ASSESSMENT & PLAN:  Rectal cancer ([place]) #  Rectal cancer-DEC 2018- PET scan-no evidence of local recurrence or distant metastatic disease. Continue surveillaince colo in Jan 2020 [Dr.Skulskie]  # Abdominal discomfort- Likely secondary to parastomal hernia-status post surgery-currently improved.   No concerns for recurrence.  Surveillance as above.  # rectal cancer- father in 62s; personal rectal cancer-59. 2 sisters [younger]; no children. ? Mom- colon cancer?.  Will discuss genetic testing at next visit  # Peripheral Neuropathy- G-2; stable. Reluctant to try any medications; discussed re: acupuncture; not interested.   # DISPOSITION:  # follow up in 12 monmts/labs -cbc/cmp/CEA-Dr.B  Cc; Dr.Smith   No orders of the defined types were placed in this encounter.  All questions were answered. The patient knows to call the clinic with any problems, questions or concerns.      Cammie Sickle, MD 01/28/2018 6:47 PM

## 2018-01-26 LAB — CEA: CEA1: 4.4 ng/mL (ref 0.0–4.7)

## 2018-02-22 ENCOUNTER — Encounter: Payer: Self-pay | Admitting: *Deleted

## 2018-02-23 ENCOUNTER — Ambulatory Visit: Payer: Medicare Other | Admitting: Anesthesiology

## 2018-02-23 ENCOUNTER — Ambulatory Visit
Admission: RE | Admit: 2018-02-23 | Discharge: 2018-02-23 | Disposition: A | Discharge status: Home / Self Care | Payer: Medicare Other | Attending: Gastroenterology | Admitting: Gastroenterology

## 2018-02-23 ENCOUNTER — Encounter
Admission: RE | Disposition: A | Discharge status: Home / Self Care | Payer: Self-pay | Source: Home / Self Care | Attending: Gastroenterology

## 2018-02-23 ENCOUNTER — Encounter: Payer: Self-pay | Admitting: *Deleted

## 2018-02-23 DIAGNOSIS — Z86010 Personal history of colonic polyps: Secondary | ICD-10-CM | POA: Diagnosis not present

## 2018-02-23 DIAGNOSIS — D124 Benign neoplasm of descending colon: Secondary | ICD-10-CM | POA: Insufficient documentation

## 2018-02-23 DIAGNOSIS — Z09 Encounter for follow-up examination after completed treatment for conditions other than malignant neoplasm: Secondary | ICD-10-CM | POA: Diagnosis present

## 2018-02-23 DIAGNOSIS — Z85048 Personal history of other malignant neoplasm of rectum, rectosigmoid junction, and anus: Secondary | ICD-10-CM | POA: Insufficient documentation

## 2018-02-23 DIAGNOSIS — I10 Essential (primary) hypertension: Secondary | ICD-10-CM | POA: Insufficient documentation

## 2018-02-23 DIAGNOSIS — I2510 Atherosclerotic heart disease of native coronary artery without angina pectoris: Secondary | ICD-10-CM | POA: Diagnosis not present

## 2018-02-23 DIAGNOSIS — K5730 Diverticulosis of large intestine without perforation or abscess without bleeding: Secondary | ICD-10-CM | POA: Insufficient documentation

## 2018-02-23 DIAGNOSIS — D120 Benign neoplasm of cecum: Secondary | ICD-10-CM | POA: Insufficient documentation

## 2018-02-23 HISTORY — DX: Hyperlipidemia, unspecified: E78.5

## 2018-02-23 HISTORY — PX: COLONOSCOPY WITH PROPOFOL: SHX5780

## 2018-02-23 HISTORY — DX: Benign neoplasm of colon, unspecified: D12.6

## 2018-02-23 HISTORY — DX: Other intervertebral disc degeneration, lumbosacral region without mention of lumbar back pain or lower extremity pain: M51.379

## 2018-02-23 HISTORY — DX: Personal history of other malignant neoplasm of large intestine: Z85.038

## 2018-02-23 HISTORY — DX: Personal history of other malignant neoplasm of rectum, rectosigmoid junction, and anus: Z85.048

## 2018-02-23 HISTORY — DX: Other specified symptoms and signs involving the digestive system and abdomen: R19.8

## 2018-02-23 HISTORY — DX: Other intervertebral disc degeneration, lumbosacral region: M51.37

## 2018-02-23 SURGERY — COLONOSCOPY WITH PROPOFOL
Anesthesia: General

## 2018-02-23 MED ORDER — MIDAZOLAM HCL 2 MG/2ML IJ SOLN
INTRAMUSCULAR | Status: AC
  Filled 2018-02-23: qty 2

## 2018-02-23 MED ORDER — SODIUM CHLORIDE 0.9 % IV SOLN
INTRAVENOUS | Status: DC
  Administered 2018-02-23 (×2): via INTRAVENOUS

## 2018-02-23 MED ORDER — MIDAZOLAM HCL 2 MG/2ML IJ SOLN
INTRAMUSCULAR | Status: DC | PRN
  Administered 2018-02-23: 2 mg via INTRAVENOUS

## 2018-02-23 MED ORDER — PROPOFOL 10 MG/ML IV BOLUS
INTRAVENOUS | Status: DC | PRN
  Administered 2018-02-23 (×2): 50 mg via INTRAVENOUS

## 2018-02-23 MED ORDER — PROPOFOL 10 MG/ML IV BOLUS
INTRAVENOUS | Status: AC
  Filled 2018-02-23: qty 60

## 2018-02-23 MED ORDER — LIDOCAINE HCL (CARDIAC) PF 100 MG/5ML IV SOSY
PREFILLED_SYRINGE | INTRAVENOUS | Status: DC | PRN
  Administered 2018-02-23: 60 mg via INTRAVENOUS

## 2018-02-23 MED ORDER — PROPOFOL 500 MG/50ML IV EMUL
INTRAVENOUS | Status: DC | PRN
  Administered 2018-02-23: 80 ug/kg/min via INTRAVENOUS

## 2018-02-23 NOTE — H&P (Signed)
Outpatient short stay form Pre-procedure 02/23/2018 1:21 PM Jeffrey Sails MD  Primary Physician: Fulton Reek MD  Reason for visit: Colonoscopy  History of present illness: Patient is a 68 year old male presenting today for a colonoscopy through ostomy.  History of a rectal cancer in 2010.  He had a abdominoperineal proctocolectomy.  Had chemotherapy and radiation.  He does follow with oncology and his last CEA done about 6 months ago was 3.7.  Overall he is been doing well.  He does have a little tiny opening at the rectum location that will occasionally she had mucousy or pink mucousy material.  This has been reevaluated by surgery.  He also since I last saw him had a hernia above the ostomy site which has been surgically corrected.  He tolerated his prep well.  He takes no aspirin or blood thinning agent.    Current Facility-Administered Medications:  .  0.9 %  sodium chloride infusion, , Intravenous, Continuous, Jeffrey Sails, MD, Last Rate: 20 mL/hr at 02/23/18 1251  Medications Prior to Admission  Medication Sig Dispense Refill Last Dose  . lisinopril-hydrochlorothiazide (PRINZIDE,ZESTORETIC) 10-12.5 MG per tablet Take 1 tablet by mouth once daily   02/23/2018 at o600am  . simethicone (MYLICON) 80 MG chewable tablet Chew 80 mg by mouth every 6 (six) hours as needed for flatulence.    Taking  . sodium bicarbonate 650 MG tablet Take 650 mg by mouth daily as needed for heartburn.   Not Taking at Unknown time     Allergies  Allergen Reactions  . Aloxi [Palonosetron Hcl] Other (See Comments)    Unknown reaction  . Decadron [Dexamethasone] Other (See Comments)    Unknown reaction   . Emend [Aprepitant] Other (See Comments)    Burning sensation from legs to head  . Hydrocodone-Acetaminophen Other (See Comments)    Unknown reaction  . Penicillin V Potassium Other (See Comments)    Edema Has patient had a PCN reaction causing immediate rash, facial/tongue/throat swelling,  SOB or lightheadedness with hypotension: Yes Has patient had a PCN reaction causing severe rash involving mucus membranes or skin necrosis: Unknown Has patient had a PCN reaction that required hospitalization: Unknown Has patient had a PCN reaction occurring within the last 10 years: No If all of the above answers are "NO", then may proceed with Cephalosporin use.     Past Medical History:  Diagnosis Date  . Abnormal findings on esophagogastroduodenoscopy (EGD)   . Benign neoplasm of colon   . Cancer [facility])    Rectal   . Coronary artery disease   . DDD (degenerative disc disease), cervical   . DDD (degenerative disc disease), lumbosacral   . Diverticulosis   . Fundic gland polyps of stomach, benign   . GERD (gastroesophageal reflux disease)   . Hemorrhage of rectum and anus   . History of colorectal malignant neoplasm   . History of hiatal hernia   . Hyperlipemia   . Hypertension   . Pneumothorax, spontaneous, tension   . Rectal cancer ([place]) 09/11/2014   colon    Review of systems:      Physical Exam    Heart and lungs: Regular rate and rhythm without rub or gallop, lungs are bilaterally clear.    HEENT: Normocephalic atraumatic eyes are anicteric    Other:    Pertinant exam for procedure: Soft nontender nondistended bowel sounds positive normoactive.    Planned proceedures: Colonoscopy via left lower quadrant ostomy and indicated procedures. I have discussed the risks  benefits and complications of procedures to include not limited to bleeding, infection, perforation and the risk of sedation and the patient wishes to proceed.    Jeffrey Sails, MD Gastroenterology 02/23/2018  1:21 PM

## 2018-02-23 NOTE — Anesthesia Preprocedure Evaluation (Signed)
Anesthesia Evaluation  Patient identified by MRN, date of birth, ID band Patient awake    Reviewed: Allergy & Precautions, NPO status , Patient's Chart, lab work & pertinent test results  History of Anesthesia Complications Negative for: history of anesthetic complications  Airway Mallampati: III  TM Distance: >3 FB Neck ROM: Full    Dental  (+) Poor Dentition   Pulmonary neg pulmonary ROS, neg sleep apnea, neg COPD,    breath sounds clear to auscultation- rhonchi (-) wheezing      Cardiovascular Exercise Tolerance: Good hypertension, + CAD (nonocclusive)  (-) Past MI, (-) Cardiac Stents and (-) CABG  Rhythm:Regular Rate:Normal - Systolic murmurs and - Diastolic murmurs    Neuro/Psych neg Seizures negative neurological ROS  negative psych ROS   GI/Hepatic Neg liver ROS, hiatal hernia, GERD  ,  Endo/Other  negative endocrine ROSneg diabetes  Renal/GU negative Renal ROS     Musculoskeletal  (+) Arthritis ,   Abdominal (+) + obese,   Peds  Hematology negative hematology ROS (+)   Anesthesia Other Findings Past Medical History: No date: Abnormal findings on esophagogastroduodenoscopy (EGD) No date: Benign neoplasm of colon No date: Cancer (HCC)     Comment:  Rectal  No date: Coronary artery disease No date: DDD (degenerative disc disease), cervical No date: DDD (degenerative disc disease), lumbosacral No date: Diverticulosis No date: Fundic gland polyps of stomach, benign No date: GERD (gastroesophageal reflux disease) No date: Hemorrhage of rectum and anus No date: History of colorectal malignant neoplasm No date: History of hiatal hernia No date: Hyperlipemia No date: Hypertension No date: Pneumothorax, spontaneous, tension 09/11/2014: Rectal cancer ([place])     Comment:  colon   Reproductive/Obstetrics                             Anesthesia Physical Anesthesia Plan  ASA:  III  Anesthesia Plan: General   Post-op Pain Management:    Induction: Intravenous  PONV Risk Score and Plan: 1 and Propofol infusion  Airway Management Planned: Natural Airway  Additional Equipment:   Intra-op Plan:   Post-operative Plan:   Informed Consent: I have reviewed the patients History and Physical, chart, labs and discussed the procedure including the risks, benefits and alternatives for the proposed anesthesia with the patient or authorized representative who has indicated his/her understanding and acceptance.     Dental advisory given  Plan Discussed with: CRNA and Anesthesiologist  Anesthesia Plan Comments:         Anesthesia Quick Evaluation

## 2018-02-23 NOTE — Anesthesia Post-op Follow-up Note (Signed)
Anesthesia QCDR form completed.        

## 2018-02-23 NOTE — Op Note (Signed)
[facility] Gastroenterology Patient Name: Jeffrey Roberson Procedure Date: 02/23/2018 1:08 PM MRN: [id number] Account #: [id number] Date of Birth: 10/13/1950 Admit Type: Outpatient Age: 68 Room: [facility] ENDO ROOM 1 Gender: Male Note Status: Finalized Procedure:            Colonoscopy Indications:          Personal history of malignant rectal neoplasm, Personal                        history of colonic polyps Providers:            Lollie Sails, MD Referring MD:         Leonie Douglas. Doy Hutching, MD (Referring MD) Medicines:            Monitored Anesthesia Care Complications:        No immediate complications. Procedure:            Pre-Anesthesia Assessment:                       - ASA Grade Assessment: III - A patient with severe                        systemic disease.                       After obtaining informed consent, the colonoscope was                        passed under direct vision. Throughout the procedure,                        the patient's blood pressure, pulse, and oxygen                        saturations were monitored continuously. The                        Colonoscope was introduced through the sigmoid                        colostomy and advanced to the the cecum, identified by                        appendiceal orifice and ileocecal valve. The                        colonoscopy was performed with moderate difficulty due                        to post-surgical anatomy. The patient tolerated the                        procedure well. The quality of the bowel preparation                        was good. Findings:      Two sessile polyps were found in the cecum. The polyps were 6 to 18 mm       in size. These polyps were removed with a lift and cut (no       saline)piecemeal technique using a hot snare. Resection and  retrieval       were complete.      A 3 mm polyp was found in the descending colon. The polyp was sessile.       The polyp was  removed with a cold biopsy forceps. Resection and       retrieval were complete.      A 2 mm polyp was found at 4 cm proximal to the stoma. The polyp was       sessile. The polyp was removed with a cold biopsy forceps. Resection and       retrieval were complete.      Multiple small to medium-mouthed diverticula were found in the sigmoid       colon, descending colon, transverse colon and ascending colon. Impression:           - Two 6 to 18 mm polyps in the cecum, removed piecemeal                        using a hot snare. Resected and retrieved.                       - One 3 mm polyp in the descending colon, removed with                        a cold biopsy forceps. Resected and retrieved.                       - One 2 mm polyp at 4 cm proximal to the stoma, removed                        with a cold biopsy forceps. Resected and retrieved.                       - Diverticulosis in the sigmoid colon, in the                        descending colon, in the transverse colon and in the                        ascending colon. Recommendation:       - Discharge patient to home.                       - Clear liquid diet today.                       - Full liquid diet for 1 day, then advance as tolerated                        to soft diet for 3 days. Procedure Code(s):    --- Professional ---                       [phone], Colonoscopy through stoma; with removal of                        tumor(s), polyp(s), or other lesion(s) by snare                        technique  [id number], 28, Colonoscopy through stoma; with biopsy,                        single or multiple Diagnosis Code(s):    --- Professional ---                       D12.0, Benign neoplasm of cecum                       D12.4, Benign neoplasm of descending colon                       Z85.048, Personal history of other malignant neoplasm                        of rectum, rectosigmoid junction, and anus                        Z86.010, Personal history of colonic polyps                       K57.30, Diverticulosis of large intestine without                        perforation or abscess without bleeding CPT copyright 2018 American Medical Association. All rights reserved. The codes documented in this report are preliminary and upon coder review may  be revised to meet current compliance requirements. Lollie Sails, MD 02/23/2018 2:46:25 PM This report has been signed electronically. Number of Addenda: 0 Note Initiated On: 02/23/2018 1:08 PM Scope Withdrawal Time: 0 hours 42 minutes 9 seconds  Total Procedure Duration: 0 hours 58 minutes 13 seconds       [facility]

## 2018-02-23 NOTE — Transfer of Care (Signed)
Immediate Anesthesia Transfer of Care Note  Patient: Jeffrey Roberson  Procedure(s) Performed: COLONOSCOPY WITH PROPOFOL (N/A )  Patient Location: PACU and Endoscopy Unit  Anesthesia Type:General  Level of Consciousness: awake, drowsy and patient cooperative  Airway & Oxygen Therapy: Patient Spontanous Breathing  Post-op Assessment: Report given to RN, Post -op Vital signs reviewed and stable and Patient moving all extremities  Post vital signs: Reviewed and stable  Last Vitals:  Vitals Value Taken Time  BP 105/88 02/23/2018  2:44 PM  Temp 36.3 C 02/23/2018  2:44 PM  Pulse 98 02/23/2018  2:48 PM  Resp 16 02/23/2018  2:48 PM  SpO2 99 % 02/23/2018  2:48 PM  Vitals shown include unvalidated device data.  Last Pain:  Vitals:   02/23/18 1444  TempSrc: Tympanic  PainSc: 0-No pain      Patients Stated Pain Goal: 0 (81/15/72 6203)  Complications: No apparent anesthesia complications

## 2018-02-26 NOTE — Anesthesia Postprocedure Evaluation (Signed)
Anesthesia Post Note  Patient: [name]  Procedure(s) Performed: COLONOSCOPY WITH PROPOFOL (N/A )  Patient location during evaluation: Endoscopy Anesthesia Type: General Level of consciousness: awake and alert Pain management: pain level controlled Vital Signs Assessment: post-procedure vital signs reviewed and stable Respiratory status: spontaneous breathing, nonlabored ventilation, respiratory function stable and patient connected to nasal cannula oxygen Cardiovascular status: blood pressure returned to baseline and stable Postop Assessment: no apparent nausea or vomiting Anesthetic complications: no     Last Vitals:  Vitals:   02/23/18 1444 02/23/18 1455  BP: 105/88 112/85  Pulse: 100 96  Resp: (!) 21 17  Temp: (!) 36.3 C   SpO2: 100% 100%    Last Pain:  Vitals:   02/23/18 1455  TempSrc:   PainSc: 0-No pain                 Martha Clan

## 2018-02-27 LAB — SURGICAL PATHOLOGY

## 2019-01-25 ENCOUNTER — Other Ambulatory Visit: Payer: Self-pay

## 2019-01-25 ENCOUNTER — Encounter: Payer: Self-pay | Admitting: Internal Medicine

## 2019-01-28 ENCOUNTER — Inpatient Hospital Stay: Payer: Medicare Other | Attending: Internal Medicine

## 2019-01-28 ENCOUNTER — Inpatient Hospital Stay (HOSPITAL_BASED_OUTPATIENT_CLINIC_OR_DEPARTMENT_OTHER): Payer: Medicare Other | Admitting: Internal Medicine

## 2019-01-28 ENCOUNTER — Other Ambulatory Visit: Payer: Self-pay

## 2019-01-28 VITALS — BP 119/79 | HR 96 | Temp 97.1°F | Wt 186.1 lb

## 2019-01-28 DIAGNOSIS — Z933 Colostomy status: Secondary | ICD-10-CM | POA: Diagnosis not present

## 2019-01-28 DIAGNOSIS — G629 Polyneuropathy, unspecified: Secondary | ICD-10-CM | POA: Insufficient documentation

## 2019-01-28 DIAGNOSIS — C20 Malignant neoplasm of rectum: Secondary | ICD-10-CM

## 2019-01-28 DIAGNOSIS — Z85048 Personal history of other malignant neoplasm of rectum, rectosigmoid junction, and anus: Secondary | ICD-10-CM | POA: Diagnosis present

## 2019-01-28 LAB — CBC WITH DIFFERENTIAL/PLATELET
Abs Immature Granulocytes: 0.04 10*3/uL (ref 0.00–0.07)
Basophils Absolute: 0.1 10*3/uL (ref 0.0–0.1)
Basophils Relative: 1 %
Eosinophils Absolute: 0.1 10*3/uL (ref 0.0–0.5)
Eosinophils Relative: 1 %
HCT: 49.6 % (ref 39.0–52.0)
Hemoglobin: 15.8 g/dL (ref 13.0–17.0)
Immature Granulocytes: 1 %
Lymphocytes Relative: 12 %
Lymphs Abs: 0.8 10*3/uL (ref 0.7–4.0)
MCH: 28.4 pg (ref 26.0–34.0)
MCHC: 31.9 g/dL (ref 30.0–36.0)
MCV: 89 fL (ref 80.0–100.0)
Monocytes Absolute: 0.7 10*3/uL (ref 0.1–1.0)
Monocytes Relative: 11 %
Neutro Abs: 5 10*3/uL (ref 1.7–7.7)
Neutrophils Relative %: 74 %
Platelets: 225 10*3/uL (ref 150–400)
RBC: 5.57 MIL/uL (ref 4.22–5.81)
RDW: 13.5 % (ref 11.5–15.5)
WBC: 6.7 10*3/uL (ref 4.0–10.5)
nRBC: 0 % (ref 0.0–0.2)

## 2019-01-28 LAB — COMPREHENSIVE METABOLIC PANEL
ALT: 18 U/L (ref 0–44)
AST: 17 U/L (ref 15–41)
Albumin: 4.2 g/dL (ref 3.5–5.0)
Alkaline Phosphatase: 45 U/L (ref 38–126)
Anion gap: 6 (ref 5–15)
BUN: 22 mg/dL (ref 8–23)
CO2: 28 mmol/L (ref 22–32)
Calcium: 9.1 mg/dL (ref 8.9–10.3)
Chloride: 105 mmol/L (ref 98–111)
Creatinine, Ser: 1 mg/dL (ref 0.61–1.24)
GFR calc Af Amer: >60 mL/min (ref >60)
GFR calc non Af Amer: >60 mL/min (ref >60)
Glucose, Bld: 101 mg/dL — ABNORMAL HIGH (ref 70–99)
Potassium: 4.1 mmol/L (ref 3.5–5.1)
Sodium: 139 mmol/L (ref 135–145)
Total Bilirubin: 0.7 mg/dL (ref 0.3–1.2)
Total Protein: 6.9 g/dL (ref 6.5–8.1)

## 2019-01-28 NOTE — Assessment & Plan Note (Addendum)
#    Rectal cancer-DEC 2018- PET scan-no evidence of local recurrence or distant metastatic disease.s/p surveillaince colo in Jan 2020 [Dr.Skulskie]- multiple polyps s/p resection.   # Abdominal discomfort- Likely secondary to parastomal hernia-status post surgery-improved.   #Personal/familial predisposition to malignancy -rectal cancer- father in 59s; personal rectal cancer-59. 2 sisters [younger]; no biological- children. ? Mom- Discussed genetic counselling at length- personal/family; wants to wait.  He will let us know if he is interested.  # Peripheral Neuropathy- G-2;STABLE. . Reluctant to try any medications; discussed re: acupuncture; not interested.   # DISPOSITION:  # follow up in 12 months/labs;MD -cbc/cmp/CEA-Dr.B  Cc; Dr.[place].

## 2019-01-28 NOTE — Progress Notes (Signed)
[place] OFFICE PROGRESS NOTE  Patient Care Team: Idelle Crouch, MD as PCP - General (Internal Medicine)  Cancer Staging Rectal cancer [facility]) Staging form: Colon and Rectum, AJCC 7th Edition - Clinical stage from 06/15/2009: Stage IIA (T3, N0, M0) - Signed by Evlyn Kanner, NP on 09/16/2014    Oncology History Overview Note  # 2010- rectal cancer T3 N0 M0, clinically stage II. At that time he also had a small but nondiagnostic pelvic node, negative by PET scan. Status post treatment with concurrent chemotherapy and radiation. Patient completed short course of Xeloda with radiation, then abdominoperineal resection in 2010 with FOLFOX starting in April 2011 [Dr.Smith; Dr.Gittin]  # FEB 2018- CT A/P- chronic presacral changes; parastomal hernia. [place] 2018- PET-NED  # DEC 2020-discussed genetic counseling; pt undecided  DIAGNOSIS: RECTAL CA  STAGE:   II      ;GOALS: cure  CURRENT/MOST RECENT THERAPY: surveillaince    Rectal cancer ([place])     INTERVAL HISTORY:  Jeffrey Roberson 68 y.o.  male pleasant patient above history of Rectal cancer stage II-is here for follow-up.  States his abdominal pain from the parastomal hernia is improved since surgery.  Patient had a colonoscopy in January 2020-record multiple polyps.  He is not sure about the surveillance colonoscopies.  He continues to have chronic tingling and numbness in extremities.  Not any worse.  Review of Systems  Constitutional: Negative for chills, diaphoresis, fever, malaise/fatigue and weight loss.  HENT: Negative for nosebleeds and sore throat.   Eyes: Negative for double vision.  Respiratory: Negative for cough, hemoptysis, sputum production, shortness of breath and wheezing.   Cardiovascular: Negative for chest pain, palpitations, orthopnea and leg swelling.  Gastrointestinal: Negative for abdominal pain, blood in stool, constipation, diarrhea, heartburn, melena, nausea and vomiting.   Genitourinary: Negative for dysuria, frequency and urgency.  Musculoskeletal: Negative for back pain and joint pain.  Skin: Negative.  Negative for itching and rash.  Neurological: Positive for tingling. Negative for dizziness, focal weakness, weakness and headaches.  Endo/Heme/Allergies: Does not bruise/bleed easily.  Psychiatric/Behavioral: Negative for depression. The patient is not nervous/anxious and does not have insomnia.      PAST MEDICAL HISTORY :  Past Medical History:  Diagnosis Date  . Abnormal findings on esophagogastroduodenoscopy (EGD)   . Benign neoplasm of colon   . Cancer [facility])    Rectal   . Coronary artery disease   . DDD (degenerative disc disease), cervical   . DDD (degenerative disc disease), lumbosacral   . Diverticulosis   . Fundic gland polyps of stomach, benign   . GERD (gastroesophageal reflux disease)   . Hemorrhage of rectum and anus   . History of colorectal malignant neoplasm   . History of hiatal hernia   . Hyperlipemia   . Hypertension   . Pneumothorax, spontaneous, tension   . Rectal cancer ([place]) 09/11/2014   colon    PAST SURGICAL HISTORY :   Past Surgical History:  Procedure Laterality Date  . ABDOMINOPERINEAL PROCTOCOLECTOMY  10/18/11  . COLON SURGERY    . COLONOSCOPY N/A 12/23/2014   Procedure: COLONOSCOPY;  Surgeon: Lollie Sails, MD;  Location: [facility] ENDOSCOPY;  Service: Endoscopy;  Laterality: N/A;  . COLONOSCOPY WITH PROPOFOL N/A 02/23/2018   Procedure: COLONOSCOPY WITH PROPOFOL;  Surgeon: Lollie Sails, MD;  Location: [facility] ENDOSCOPY;  Service: Endoscopy;  Laterality: N/A;  . Colostomy Placement    . INSERTION OF MESH N/A 03/17/2017   Procedure: INSERTION OF MESH;  Surgeon: Leonie Green, MD;  Location: [facility] ORS;  Service: General;  Laterality: N/A;  . PARASTOMAL HERNIA REPAIR N/A 03/17/2017   Procedure: HERNIA REPAIR PARASTOMAL;  Surgeon: Leonie Green, MD;  Location: [facility] ORS;  Service: General;  Laterality: N/A;     FAMILY HISTORY :   Family History  Problem Relation Age of Onset  . Colon cancer Father     SOCIAL HISTORY:   Social History   Tobacco Use  . Smoking status: Never Smoker  . Smokeless tobacco: Never Used  Substance Use Topics  . Alcohol use: No  . Drug use: No    ALLERGIES:  is allergic to aloxi [palonosetron hcl]; decadron [dexamethasone]; emend [aprepitant]; hydrocodone-acetaminophen; and penicillin v potassium.  MEDICATIONS:  Current Outpatient Medications  Medication Sig Dispense Refill  . lisinopril-hydrochlorothiazide (PRINZIDE,ZESTORETIC) 10-12.5 MG per tablet Take 1 tablet by mouth once daily    . simethicone (MYLICON) 80 MG chewable tablet Chew 80 mg by mouth every 6 (six) hours as needed for flatulence.     . sodium bicarbonate 650 MG tablet Take 650 mg by mouth daily as needed for heartburn.     No current facility-administered medications for this visit.    PHYSICAL EXAMINATION: ECOG PERFORMANCE STATUS: 0 - Asymptomatic  BP 119/79 (BP Location: Left Arm, Patient Position: Sitting, Cuff Size: Normal)   Pulse 96   Temp (!) 97.1 F (36.2 C) (Tympanic)   Wt 186 lb 2 oz (84.4 kg)   BMI 30.97 kg/m   Filed Weights   01/25/19 1300  Weight: 186 lb 2 oz (84.4 kg)    Physical Exam  Constitutional: He is oriented to person, place, and time and well-developed, well-nourished, and in no distress.  HENT:  Head: Normocephalic and atraumatic.  Mouth/Throat: Oropharynx is clear and moist. No oropharyngeal exudate.  Eyes: Pupils are equal, round, and reactive to light.  Cardiovascular: Normal rate and regular rhythm.  Pulmonary/Chest: No respiratory distress. He has no wheezes.  Abdominal: Soft. Bowel sounds are normal. He exhibits no distension and no mass. There is no abdominal tenderness. There is no rebound and no guarding.  Colostomy bag noted.  Musculoskeletal:        General: No tenderness or edema. Normal range of motion.     Cervical back: Normal range  of motion and neck supple.  Neurological: He is alert and oriented to person, place, and time.  Skin: Skin is warm.  Psychiatric: Affect normal.     LABORATORY DATA:  I have reviewed the data as listed    Component Value Date/Time   NA 139 01/28/2019 1011   K 4.1 01/28/2019 1011   CL 105 01/28/2019 1011   CO2 28 01/28/2019 1011   GLUCOSE 101 (H) 01/28/2019 1011   BUN 22 01/28/2019 1011   CREATININE 1.00 01/28/2019 1011   CREATININE 1.01 03/15/2012 0950   CALCIUM 9.1 01/28/2019 1011   PROT 6.9 01/28/2019 1011   PROT 7.2 11/10/2011 1010   ALBUMIN 4.2 01/28/2019 1011   ALBUMIN 3.8 11/10/2011 1010   AST 17 01/28/2019 1011   AST 17 11/10/2011 1010   ALT 18 01/28/2019 1011   ALT 22 11/10/2011 1010   ALKPHOS 45 01/28/2019 1011   ALKPHOS 69 11/10/2011 1010   BILITOT 0.7 01/28/2019 1011   BILITOT 0.5 11/10/2011 1010   GFRNONAA >60 01/28/2019 1011   GFRNONAA >60 03/15/2012 0950   GFRAA >60 01/28/2019 1011   GFRAA >60 03/15/2012 0950    No results found for: SPEP,  UPEP  Lab Results  Component Value Date   WBC 6.7 01/28/2019   NEUTROABS 5.0 01/28/2019   HGB 15.8 01/28/2019   HCT 49.6 01/28/2019   MCV 89.0 01/28/2019   PLT 225 01/28/2019      Chemistry      Component Value Date/Time   NA 139 01/28/2019 1011   K 4.1 01/28/2019 1011   CL 105 01/28/2019 1011   CO2 28 01/28/2019 1011   BUN 22 01/28/2019 1011   CREATININE 1.00 01/28/2019 1011   CREATININE 1.01 03/15/2012 0950      Component Value Date/Time   CALCIUM 9.1 01/28/2019 1011   ALKPHOS 45 01/28/2019 1011   ALKPHOS 69 11/10/2011 1010   AST 17 01/28/2019 1011   AST 17 11/10/2011 1010   ALT 18 01/28/2019 1011   ALT 22 11/10/2011 1010   BILITOT 0.7 01/28/2019 1011   BILITOT 0.5 11/10/2011 1010       RADIOGRAPHIC STUDIES: I have personally reviewed the radiological images as listed and agreed with the findings in the report. No results found.  IMPRESSION: 1. No evidence of local rectal carcinoma  recurrence the pelvis. 2. No evidence of metastatic disease. 3. Large peristomal hernia along the LEFT lower quadrant colostomy without evidence of obstruction.   Electronically Signed   By: Suzy Bouchard M.D.   On: 01/23/2017 13:14  ASSESSMENT & PLAN:  Rectal cancer ([place]) #  Rectal cancer-DEC 2018- PET scan-no evidence of local recurrence or distant metastatic disease.s/p surveillaince colo in Jan 2020 [Dr.Skulskie]- multiple polyps s/p resection.   # Abdominal discomfort- Likely secondary to parastomal hernia-status post surgery-improved.   #Personal/familial predisposition to malignancy -rectal cancer- father in 63s; personal rectal cancer-58. 2 sisters [younger]; no biological- children. ? Mom- Discussed genetic counselling at length- personal/family; wants to wait.  He will let us know if he is interested.  # Peripheral Neuropathy- G-2;STABLE. . Reluctant to try any medications; discussed re: acupuncture; not interested.   # DISPOSITION:  # follow up in 12 months/labs;MD -cbc/cmp/CEA-Dr.B  Cc; Dr.Toledo/[place].    Orders Placed This Encounter  Procedures  . CBC with Differential    Standing Status:   Future    Number of Occurrences:   1    Standing Expiration Date:   01/25/2020  . Comprehensive metabolic panel    Standing Status:   Future    Number of Occurrences:   1    Standing Expiration Date:   01/25/2020  . CEA    Standing Status:   Future    Number of Occurrences:   1    Standing Expiration Date:   01/25/2020   All questions were answered. The patient knows to call the clinic with any problems, questions or concerns.      Cammie Sickle, MD 01/28/2019 1:05 PM

## 2019-01-28 NOTE — Patient Instructions (Signed)
#   call us back if interested in genetic counselling

## 2019-01-29 LAB — CEA: CEA: 4.3 ng/mL (ref 0.0–4.7)

## 2019-04-14 ENCOUNTER — Ambulatory Visit: Payer: Medicare Other | Attending: Internal Medicine

## 2019-04-14 DIAGNOSIS — Z23 Encounter for immunization: Secondary | ICD-10-CM | POA: Insufficient documentation

## 2019-04-14 NOTE — Progress Notes (Signed)
   Covid-19 Vaccination Clinic  Name:  [name]    MRN: [id number] DOB: [birth year]  04/14/2019  [name] was observed post Covid-19 immunization for 15 minutes without incident. He was provided with Vaccine Information Sheet and instruction to access the V-Safe system.   [name] was instructed to call 911 with any severe reactions post vaccine: Marland Kitchen Difficulty breathing  . Swelling of face and throat  . A fast heartbeat  . A bad rash all over body  . Dizziness and weakness   Immunizations Administered    Name Date Dose VIS Date Route   Pfizer COVID-19 Vaccine 04/14/2019  9:05 AM 0.3 mL 01/18/2019 Intramuscular   Manufacturer: [place]   Lot: [id number]   [place]: [id number]

## 2019-05-08 ENCOUNTER — Ambulatory Visit: Payer: Medicare Other | Attending: Internal Medicine

## 2019-05-08 ENCOUNTER — Other Ambulatory Visit: Payer: Self-pay

## 2019-05-08 DIAGNOSIS — Z23 Encounter for immunization: Secondary | ICD-10-CM

## 2019-05-08 NOTE — Progress Notes (Signed)
   Covid-19 Vaccination Clinic  Name:  Jeffrey Roberson    MRN: [id number] DOB: [birth year]  05/08/2019  [name] was observed post Covid-19 immunization for 15 minutes without incident. He was provided with Vaccine Information Sheet and instruction to access the V-Safe system.   [name] was instructed to call 911 with any severe reactions post vaccine: Marland Kitchen Difficulty breathing  . Swelling of face and throat  . A fast heartbeat  . A bad rash all over body  . Dizziness and weakness   Immunizations Administered    Name Date Dose VIS Date Route   Pfizer COVID-19 Vaccine 05/08/2019  3:48 PM 0.3 mL 01/18/2019 Intramuscular   Manufacturer: [place]   Lot: [id number]   [place]: [id number]

## 2019-07-25 ENCOUNTER — Other Ambulatory Visit
Admission: RE | Admit: 2019-07-25 | Discharge: 2019-07-25 | Disposition: A | Discharge status: Home / Self Care | Payer: Medicare Other | Source: Ambulatory Visit | Attending: Internal Medicine | Admitting: Internal Medicine

## 2019-07-25 ENCOUNTER — Other Ambulatory Visit: Payer: Self-pay

## 2019-07-25 DIAGNOSIS — Z01812 Encounter for preprocedural laboratory examination: Secondary | ICD-10-CM | POA: Diagnosis present

## 2019-07-25 DIAGNOSIS — Z20822 Contact with and (suspected) exposure to covid-19: Secondary | ICD-10-CM | POA: Diagnosis not present

## 2019-07-25 LAB — SARS CORONAVIRUS 2 (TAT 6-24 HRS): SARS Coronavirus 2: NEGATIVE

## 2019-07-29 ENCOUNTER — Ambulatory Visit: Payer: Medicare Other | Admitting: Anesthesiology

## 2019-07-29 ENCOUNTER — Encounter: Payer: Self-pay | Admitting: Internal Medicine

## 2019-07-29 ENCOUNTER — Other Ambulatory Visit: Payer: Self-pay

## 2019-07-29 ENCOUNTER — Encounter
Admission: RE | Disposition: A | Discharge status: Home / Self Care | Payer: Self-pay | Source: Home / Self Care | Attending: Internal Medicine

## 2019-07-29 ENCOUNTER — Ambulatory Visit
Admission: RE | Admit: 2019-07-29 | Discharge: 2019-07-29 | Disposition: A | Discharge status: Home / Self Care | Payer: Medicare Other | Attending: Internal Medicine | Admitting: Internal Medicine

## 2019-07-29 DIAGNOSIS — I10 Essential (primary) hypertension: Secondary | ICD-10-CM | POA: Diagnosis not present

## 2019-07-29 DIAGNOSIS — E669 Obesity, unspecified: Secondary | ICD-10-CM | POA: Diagnosis not present

## 2019-07-29 DIAGNOSIS — K219 Gastro-esophageal reflux disease without esophagitis: Secondary | ICD-10-CM | POA: Diagnosis not present

## 2019-07-29 DIAGNOSIS — K449 Diaphragmatic hernia without obstruction or gangrene: Secondary | ICD-10-CM | POA: Insufficient documentation

## 2019-07-29 DIAGNOSIS — I2510 Atherosclerotic heart disease of native coronary artery without angina pectoris: Secondary | ICD-10-CM | POA: Diagnosis not present

## 2019-07-29 DIAGNOSIS — K5730 Diverticulosis of large intestine without perforation or abscess without bleeding: Secondary | ICD-10-CM | POA: Insufficient documentation

## 2019-07-29 DIAGNOSIS — Z79899 Other long term (current) drug therapy: Secondary | ICD-10-CM | POA: Diagnosis not present

## 2019-07-29 DIAGNOSIS — M1990 Unspecified osteoarthritis, unspecified site: Secondary | ICD-10-CM | POA: Diagnosis not present

## 2019-07-29 DIAGNOSIS — Z85048 Personal history of other malignant neoplasm of rectum, rectosigmoid junction, and anus: Secondary | ICD-10-CM | POA: Insufficient documentation

## 2019-07-29 DIAGNOSIS — Z6827 Body mass index (BMI) 27.0-27.9, adult: Secondary | ICD-10-CM | POA: Diagnosis not present

## 2019-07-29 DIAGNOSIS — M5137 Other intervertebral disc degeneration, lumbosacral region: Secondary | ICD-10-CM | POA: Diagnosis not present

## 2019-07-29 DIAGNOSIS — K635 Polyp of colon: Secondary | ICD-10-CM | POA: Diagnosis not present

## 2019-07-29 DIAGNOSIS — Z933 Colostomy status: Secondary | ICD-10-CM | POA: Insufficient documentation

## 2019-07-29 DIAGNOSIS — M5030 Other cervical disc degeneration, unspecified cervical region: Secondary | ICD-10-CM | POA: Insufficient documentation

## 2019-07-29 DIAGNOSIS — Z885 Allergy status to narcotic agent status: Secondary | ICD-10-CM | POA: Insufficient documentation

## 2019-07-29 DIAGNOSIS — Z1211 Encounter for screening for malignant neoplasm of colon: Secondary | ICD-10-CM | POA: Diagnosis not present

## 2019-07-29 DIAGNOSIS — Z880 Allergy status to penicillin: Secondary | ICD-10-CM | POA: Insufficient documentation

## 2019-07-29 DIAGNOSIS — E785 Hyperlipidemia, unspecified: Secondary | ICD-10-CM | POA: Diagnosis not present

## 2019-07-29 DIAGNOSIS — Z888 Allergy status to other drugs, medicaments and biological substances status: Secondary | ICD-10-CM | POA: Diagnosis not present

## 2019-07-29 DIAGNOSIS — Z86010 Personal history of colonic polyps: Secondary | ICD-10-CM | POA: Insufficient documentation

## 2019-07-29 DIAGNOSIS — D122 Benign neoplasm of ascending colon: Secondary | ICD-10-CM | POA: Insufficient documentation

## 2019-07-29 HISTORY — PX: COLONOSCOPY WITH PROPOFOL: SHX5780

## 2019-07-29 SURGERY — COLONOSCOPY WITH PROPOFOL
Anesthesia: General

## 2019-07-29 MED ORDER — PROPOFOL 500 MG/50ML IV EMUL
INTRAVENOUS | Status: AC
  Filled 2019-07-29: qty 50

## 2019-07-29 MED ORDER — LIDOCAINE HCL (PF) 2 % IJ SOLN
INTRAMUSCULAR | Status: AC
  Filled 2019-07-29: qty 5

## 2019-07-29 MED ORDER — SODIUM CHLORIDE 0.9 % IV SOLN: INTRAVENOUS | Status: DC

## 2019-07-29 MED ORDER — LIDOCAINE HCL (CARDIAC) PF 100 MG/5ML IV SOSY
PREFILLED_SYRINGE | INTRAVENOUS | Status: DC | PRN
  Administered 2019-07-29: 100 mg via INTRAVENOUS

## 2019-07-29 MED ORDER — PROPOFOL 500 MG/50ML IV EMUL
INTRAVENOUS | Status: DC | PRN
  Administered 2019-07-29: 50 mg via INTRAVENOUS
  Administered 2019-07-29: 100 mg via INTRAVENOUS
  Administered 2019-07-29: 50 mg via INTRAVENOUS

## 2019-07-29 NOTE — Interval H&P Note (Signed)
History and Physical Interval Note:  07/29/2019 11:58 AM  Jeffrey Roberson  has presented today for surgery, with the diagnosis of PERSONAL HX.OF RECTAL CANCER.  The various methods of treatment have been discussed with the patient and family. After consideration of risks, benefits and other options for treatment, the patient has consented to  Procedure(s): COLONOSCOPY WITH PROPOFOL (N/A) as a surgical intervention.  The patient's history has been reviewed, patient examined, no change in status, stable for surgery.  I have reviewed the patient's chart and labs.  Questions were answered to the patient's satisfaction.     [name], [place]

## 2019-07-29 NOTE — Anesthesia Postprocedure Evaluation (Signed)
Anesthesia Post Note  Patient: Jeffrey Roberson  Procedure(s) Performed: COLONOSCOPY WITH PROPOFOL (N/A )  Patient location during evaluation: Endoscopy Anesthesia Type: General Level of consciousness: awake and alert Pain management: pain level controlled Vital Signs Assessment: post-procedure vital signs reviewed and stable Respiratory status: spontaneous breathing, nonlabored ventilation, respiratory function stable and patient connected to nasal cannula oxygen Cardiovascular status: blood pressure returned to baseline and stable Postop Assessment: no apparent nausea or vomiting Anesthetic complications: no   No complications documented.   Last Vitals:  Vitals:   07/29/19 1242 07/29/19 1252  BP: (!) 110/91 113/65  Pulse: 100 82  Resp: 14 17  Temp:    SpO2: 99% 97%    Last Pain:  Vitals:   07/29/19 1252  TempSrc:   PainSc: 0-No pain                 Martha Clan

## 2019-07-29 NOTE — Op Note (Signed)
[facility] Gastroenterology Patient Name: Jeffrey Roberson Procedure Date: 07/29/2019 12:04 PM MRN: [id number] Account #: [id number] Date of Birth: 1950/05/05 Admit Type: Outpatient Age: 69 Room: [facility] ENDO ROOM 3 Gender: Male Note Status: Finalized Procedure:             Colonoscopy Indications:           High risk colon cancer surveillance: Personal history                         of multiple (3 or more) adenomas, High risk colon                         cancer surveillance: Personal history of rectal cancer Providers:             Benay Pike. Kialee Kham MD, MD Medicines:             Propofol per Anesthesia Complications:         No immediate complications. Procedure:             Pre-Anesthesia Assessment:                        - The risks and benefits of the procedure and the                         sedation options and risks were discussed with the                         patient. All questions were answered and informed                         consent was obtained.                        - Patient identification and proposed procedure were                         verified prior to the procedure by the nurse. The                         procedure was verified in the procedure room.                        - ASA Grade Assessment: III - A patient with severe                         systemic disease.                        - After reviewing the risks and benefits, the patient                         was deemed in satisfactory condition to undergo the                         procedure.                        After obtaining informed consent, the colonoscope was  passed under direct vision. Throughout the procedure,                         the patient's blood pressure, pulse, and oxygen                         saturations were monitored continuously. The                         Colonoscope was introduced through the sigmoid                          colostomy and advanced to the the cecum, identified by                         appendiceal orifice and ileocecal valve. The                         colonoscopy was performed without difficulty. The                         patient tolerated the procedure well. The quality of                         the bowel preparation was adequate. The ileocecal                         valve, appendiceal orifice, and rectum were                         photographed. Findings:      There was evidence of a widely patent end colostomy in the sigmoid       colon. This was characterized by healthy appearing mucosa.      Three sessile and semi-pedunculated polyps were found in the ascending       colon. The polyps were 5 to 9 mm in size. These polyps were removed with       a cold snare. Resection and retrieval were complete.      Multiple small and large-mouthed diverticula were found in the entire       colon.      The exam was otherwise without abnormality. Impression:            - Widely patent end colostomy with healthy appearing                         mucosa in the sigmoid colon.                        - Three 5 to 9 mm polyps in the ascending colon,                         removed with a cold snare. Resected and retrieved.                        - Diverticulosis in the sigmoid colon.                        - The examination was otherwise normal. Recommendation:        -  Patient has a contact number available for                         emergencies. The signs and symptoms of potential                         delayed complications were discussed with the patient.                         Return to normal activities tomorrow. Written                         discharge instructions were provided to the patient.                        - Resume previous diet.                        - Continue present medications.                        - Await pathology results.                        - Repeat colonoscopy  is recommended for surveillance.                         The colonoscopy date will be determined after                         pathology results from today's exam become available                         for review.                        - Return to GI office PRN.                        - The findings and recommendations were discussed with                         the patient. Procedure Code(s):     --- Professional ---                        [phone], Colonoscopy through stoma; with removal of                         tumor(s), polyp(s), or other lesion(s) by snare                         technique Diagnosis Code(s):     --- Professional ---                        K57.30, Diverticulosis of large intestine without                         perforation or abscess without bleeding  K63.5, Polyp of colon                        Z93.3, Colostomy status                        Z85.048, Personal history of other malignant neoplasm                         of rectum, rectosigmoid junction, and anus                        Z86.010, Personal history of colonic polyps CPT copyright 2019 American Medical Association. All rights reserved. The codes documented in this report are preliminary and upon coder review may  be revised to meet current compliance requirements. Efrain Sella MD, MD 07/29/2019 12:35:46 PM This report has been signed electronically. Number of Addenda: 0 Note Initiated On: 07/29/2019 12:04 PM Scope Withdrawal Time: 0 hours 9 minutes 32 seconds  Total Procedure Duration: 0 hours 12 minutes 8 seconds  Estimated Blood Loss:  Estimated blood loss: none. Estimated blood loss: none.      [facility]

## 2019-07-29 NOTE — H&P (Signed)
Outpatient short stay form Pre-procedure 07/29/2019 11:56 AM Jeffrey Roberson K. [name], M.D.  Primary Physician: Fulton Reek, M.D.  Reason for visit:  Personal history of rectal cancer, adenomatous colon polyps  History of present illness:                            Patient presents for colonoscopy for a personal hx of colon polyps. The patient denies abdominal pain, abnormal weight loss or rectal bleeding.      Current Facility-Administered Medications:  .  0.9 %  sodium chloride infusion, , Intravenous, Continuous, [place], Benay Pike, MD, Last Rate: 20 mL/hr at 07/29/19 1117, New Bag at 07/29/19 1117  Medications Prior to Admission  Medication Sig Dispense Refill Last Dose  . lisinopril-hydrochlorothiazide (PRINZIDE,ZESTORETIC) 10-12.5 MG per tablet Take 1 tablet by mouth once daily   07/29/2019 at 0600  . simethicone (MYLICON) 80 MG chewable tablet Chew 80 mg by mouth every 6 (six) hours as needed for flatulence.      . sodium bicarbonate 650 MG tablet Take 650 mg by mouth daily as needed for heartburn. (Patient not taking: Reported on 07/29/2019)   Not Taking at Unknown time     Allergies  Allergen Reactions  . Aloxi [Palonosetron Hcl] Other (See Comments)    Unknown reaction  . Decadron [Dexamethasone] Other (See Comments)    Unknown reaction   . Emend [Aprepitant] Other (See Comments)    Burning sensation from legs to head  . Hydrocodone-Acetaminophen Other (See Comments)    Unknown reaction  . Penicillin V Potassium Other (See Comments)    Edema Has patient had a PCN reaction causing immediate rash, facial/tongue/throat swelling, SOB or lightheadedness with hypotension: Yes Has patient had a PCN reaction causing severe rash involving mucus membranes or skin necrosis: Unknown Has patient had a PCN reaction that required hospitalization: Unknown Has patient had a PCN reaction occurring within the last 10 years: No If all of the above answers are "NO", then may proceed with  Cephalosporin use.     Past Medical History:  Diagnosis Date  . Abnormal findings on esophagogastroduodenoscopy (EGD)   . Benign neoplasm of colon   . Cancer [facility])    Rectal   . Coronary artery disease   . DDD (degenerative disc disease), cervical   . DDD (degenerative disc disease), lumbosacral   . Diverticulosis   . Fundic gland polyps of stomach, benign   . GERD (gastroesophageal reflux disease)   . Hemorrhage of rectum and anus   . History of colorectal malignant neoplasm   . History of hiatal hernia   . Hyperlipemia   . Hypertension   . Pneumothorax, spontaneous, tension   . Rectal cancer ([place]) 09/11/2014   colon    Review of systems:  Otherwise negative.    Physical Exam  Gen: Alert, oriented. Appears stated age.  HEENT: [place]/AT. PERRLA. Lungs: CTA, no wheezes. CV: RR nl S1, S2. Abd: soft, benign, no masses. BS+ Ext: No edema. Pulses 2+    Planned procedures: Proceed with colonoscopy. The patient understands the nature of the planned procedure, indications, risks, alternatives and potential complications including but not limited to bleeding, infection, perforation, damage to internal organs and possible oversedation/side effects from anesthesia. The patient agrees and gives consent to proceed.  Please refer to procedure notes for findings, recommendations and patient disposition/instructions.     Ludger Bones K. [name], M.D. Gastroenterology 07/29/2019  11:56 AM

## 2019-07-29 NOTE — Anesthesia Preprocedure Evaluation (Signed)
Anesthesia Evaluation  Patient identified by MRN, date of birth, ID band Patient awake    Reviewed: Allergy & Precautions, NPO status , Patient's Chart, lab work & pertinent test results  History of Anesthesia Complications Negative for: history of anesthetic complications  Airway Mallampati: III  TM Distance: >3 FB Neck ROM: Full    Dental  (+) Poor Dentition, Dental Advidsory Given   Pulmonary neg pulmonary ROS, neg sleep apnea, neg COPD,    breath sounds clear to auscultation- rhonchi (-) wheezing      Cardiovascular Exercise Tolerance: Good hypertension, (-) angina+ CAD (nonocclusive)  (-) Past MI, (-) Cardiac Stents and (-) CABG (-) dysrhythmias (-) Valvular Problems/Murmurs Rhythm:Regular Rate:Normal - Systolic murmurs and - Diastolic murmurs    Neuro/Psych neg Seizures negative neurological ROS  negative psych ROS   GI/Hepatic Neg liver ROS, hiatal hernia, GERD  ,  Endo/Other  negative endocrine ROSneg diabetes  Renal/GU negative Renal ROS     Musculoskeletal  (+) Arthritis ,   Abdominal (+) + obese,   Peds  Hematology negative hematology ROS (+)   Anesthesia Other Findings Past Medical History: No date: Abnormal findings on esophagogastroduodenoscopy (EGD) No date: Benign neoplasm of colon No date: Cancer (HCC)     Comment:  Rectal  No date: Coronary artery disease No date: DDD (degenerative disc disease), cervical No date: DDD (degenerative disc disease), lumbosacral No date: Diverticulosis No date: Fundic gland polyps of stomach, benign No date: GERD (gastroesophageal reflux disease) No date: Hemorrhage of rectum and anus No date: History of colorectal malignant neoplasm No date: History of hiatal hernia No date: Hyperlipemia No date: Hypertension No date: Pneumothorax, spontaneous, tension 09/11/2014: Rectal cancer ([place])     Comment:  colon   Reproductive/Obstetrics                              Anesthesia Physical  Anesthesia Plan  ASA: III  Anesthesia Plan: General   Post-op Pain Management:    Induction: Intravenous  PONV Risk Score and Plan: 1 and Propofol infusion and TIVA  Airway Management Planned: Natural Airway and Nasal Cannula  Additional Equipment:   Intra-op Plan:   Post-operative Plan:   Informed Consent: I have reviewed the patients History and Physical, chart, labs and discussed the procedure including the risks, benefits and alternatives for the proposed anesthesia with the patient or authorized representative who has indicated his/her understanding and acceptance.     Dental advisory given  Plan Discussed with: CRNA and Anesthesiologist  Anesthesia Plan Comments:         Anesthesia Quick Evaluation

## 2019-07-29 NOTE — Transfer of Care (Signed)
Immediate Anesthesia Transfer of Care Note  Patient: Jeffrey Roberson  Procedure(s) Performed: COLONOSCOPY WITH PROPOFOL (N/A )  Patient Location: PACU  Anesthesia Type:General  Level of Consciousness: awake  Airway & Oxygen Therapy: Patient Spontanous Breathing and Patient connected to nasal cannula oxygen  Post-op Assessment: Report given to RN and Post -op Vital signs reviewed and stable  Post vital signs: Reviewed and stable  Last Vitals:  Vitals Value Taken Time  BP    Temp    Pulse    Resp 24 07/29/19 1233  SpO2    Vitals shown include unvalidated device data.  Last Pain:  Vitals:   07/29/19 1056  TempSrc: Temporal  PainSc: 0-No pain         Complications: No complications documented.

## 2019-07-30 ENCOUNTER — Encounter: Payer: Self-pay | Admitting: Internal Medicine

## 2019-07-30 LAB — SURGICAL PATHOLOGY

## 2019-08-30 ENCOUNTER — Other Ambulatory Visit: Payer: Self-pay | Admitting: Internal Medicine

## 2019-08-30 DIAGNOSIS — R42 Dizziness and giddiness: Secondary | ICD-10-CM

## 2019-09-13 ENCOUNTER — Ambulatory Visit
Admission: RE | Admit: 2019-09-13 | Discharge: 2019-09-13 | Disposition: A | Discharge status: Home / Self Care | Payer: Medicare Other | Source: Ambulatory Visit | Attending: Internal Medicine | Admitting: Internal Medicine

## 2019-09-13 ENCOUNTER — Other Ambulatory Visit: Payer: Self-pay

## 2019-09-13 DIAGNOSIS — R42 Dizziness and giddiness: Secondary | ICD-10-CM | POA: Diagnosis not present

## 2020-01-27 ENCOUNTER — Other Ambulatory Visit: Payer: Self-pay

## 2020-01-27 DIAGNOSIS — C20 Malignant neoplasm of rectum: Secondary | ICD-10-CM

## 2020-01-28 ENCOUNTER — Inpatient Hospital Stay (HOSPITAL_BASED_OUTPATIENT_CLINIC_OR_DEPARTMENT_OTHER): Payer: Medicare Other | Admitting: Internal Medicine

## 2020-01-28 ENCOUNTER — Other Ambulatory Visit: Payer: Self-pay

## 2020-01-28 ENCOUNTER — Inpatient Hospital Stay: Payer: Medicare Other | Attending: Internal Medicine

## 2020-01-28 ENCOUNTER — Encounter: Payer: Self-pay | Admitting: Internal Medicine

## 2020-01-28 DIAGNOSIS — C20 Malignant neoplasm of rectum: Secondary | ICD-10-CM

## 2020-01-28 DIAGNOSIS — G629 Polyneuropathy, unspecified: Secondary | ICD-10-CM | POA: Insufficient documentation

## 2020-01-28 DIAGNOSIS — Z933 Colostomy status: Secondary | ICD-10-CM | POA: Insufficient documentation

## 2020-01-28 DIAGNOSIS — Z85048 Personal history of other malignant neoplasm of rectum, rectosigmoid junction, and anus: Secondary | ICD-10-CM | POA: Insufficient documentation

## 2020-01-28 LAB — CBC WITH DIFFERENTIAL/PLATELET
Abs Immature Granulocytes: 0.03 10*3/uL (ref 0.00–0.07)
Basophils Absolute: 0.1 10*3/uL (ref 0.0–0.1)
Basophils Relative: 1 %
Eosinophils Absolute: 0.1 10*3/uL (ref 0.0–0.5)
Eosinophils Relative: 2 %
HCT: 48.8 % (ref 39.0–52.0)
Hemoglobin: 16.4 g/dL (ref 13.0–17.0)
Immature Granulocytes: 1 %
Lymphocytes Relative: 13 %
Lymphs Abs: 0.9 10*3/uL (ref 0.7–4.0)
MCH: 29.4 pg (ref 26.0–34.0)
MCHC: 33.6 g/dL (ref 30.0–36.0)
MCV: 87.6 fL (ref 80.0–100.0)
Monocytes Absolute: 0.8 10*3/uL (ref 0.1–1.0)
Monocytes Relative: 12 %
Neutro Abs: 4.8 10*3/uL (ref 1.7–7.7)
Neutrophils Relative %: 71 %
Platelets: 211 10*3/uL (ref 150–400)
RBC: 5.57 MIL/uL (ref 4.22–5.81)
RDW: 13.4 % (ref 11.5–15.5)
WBC: 6.7 10*3/uL (ref 4.0–10.5)
nRBC: 0 % (ref 0.0–0.2)

## 2020-01-28 LAB — COMPREHENSIVE METABOLIC PANEL
ALT: 21 U/L (ref 0–44)
AST: 23 U/L (ref 15–41)
Albumin: 4.2 g/dL (ref 3.5–5.0)
Alkaline Phosphatase: 42 U/L (ref 38–126)
Anion gap: 12 (ref 5–15)
BUN: 22 mg/dL (ref 8–23)
CO2: 23 mmol/L (ref 22–32)
Calcium: 9.2 mg/dL (ref 8.9–10.3)
Chloride: 101 mmol/L (ref 98–111)
Creatinine, Ser: 0.84 mg/dL (ref 0.61–1.24)
GFR, Estimated: >60 mL/min (ref >60)
Glucose, Bld: 97 mg/dL (ref 70–99)
Potassium: 3.9 mmol/L (ref 3.5–5.1)
Sodium: 136 mmol/L (ref 135–145)
Total Bilirubin: 1.1 mg/dL (ref 0.3–1.2)
Total Protein: 7.1 g/dL (ref 6.5–8.1)

## 2020-01-28 NOTE — Assessment & Plan Note (Addendum)
#    Rectal cancer-stage II [2011]- DEC 2018- PET scan-no evidence of local recurrence or distant metastatic disease.s/p surveillaince colo in Jan 2020 [Dr.Skulskie]- multiple polyps s/p resection.  Recommend continued surveillance colonoscopies.  No role for any surveillance imaging.  # Abdominal discomfort- Likely secondary to parastomal hernia-status post surgery- STABLE.   #Personal/familial predisposition to malignancy -rectal cancer- father in 30s; personal rectal cancer-58. 2 sisters [younger]; no biological- children. ? Mom- Discussed genetic counselling at length- personal/family; wants to wait.  He will just wait on it/patient is not interested.  # Peripheral Neuropathy- G-2; STABLE. Reluctant to try any medications; discussed re: acupuncture; not interested.   #Regards to follow-up-discussed that patient can follow-up with PCP; and he does not need any surveillance imaging or tumor markers at this time.  We will need to get surveillance colonoscopies as recommended by GI.  # DISPOSITION:  # follow up as needed-Dr.B  Cc; Dr.Toledo/[place]; Dr.Sparks.

## 2020-01-28 NOTE — Progress Notes (Signed)
[place] OFFICE PROGRESS NOTE  Patient Care Team: Idelle Crouch, MD as PCP - General (Internal Medicine)  Cancer Staging Rectal cancer [facility]) Staging form: Colon and Rectum, AJCC 7th Edition - Clinical stage from 06/15/2009: Stage IIA (T3, N0, M0) - Signed by Evlyn Kanner, NP on 09/16/2014    Oncology History Overview Note  # 2010- rectal cancer T3 N0 M0, clinically stage II. At that time he also had a small but nondiagnostic pelvic node, negative by PET scan. Status post treatment with concurrent chemotherapy and radiation. Patient completed short course of Xeloda with radiation, then abdominoperineal resection in 2010 with FOLFOX starting in April 2011 [Dr.Smith; Dr.Gittin]  # FEB 2018- CT A/P- chronic presacral changes; parastomal hernia. [place] 2018- PET-NED  # DEC 2020-discussed genetic counseling; pt undecided  DIAGNOSIS: RECTAL CA  STAGE:   II      ;GOALS: cure  CURRENT/MOST RECENT THERAPY: surveillaince    Rectal cancer ([place])     INTERVAL HISTORY:  Jeffrey Roberson 69 y.o.  male pleasant patient above history of Rectal cancer stage II-is here for follow-up.  Patient denies any worsening of his parastomal hernia.  Abdominal pain is improved.  Denies any blood in stools or black or stools.  Chronic tingling and numbness in extremities.  Review of Systems  Constitutional: Negative for chills, diaphoresis, fever, malaise/fatigue and weight loss.  HENT: Negative for nosebleeds and sore throat.   Eyes: Negative for double vision.  Respiratory: Negative for cough, hemoptysis, sputum production, shortness of breath and wheezing.   Cardiovascular: Negative for chest pain, palpitations, orthopnea and leg swelling.  Gastrointestinal: Negative for abdominal pain, blood in stool, constipation, diarrhea, heartburn, melena, nausea and vomiting.  Genitourinary: Negative for dysuria, frequency and urgency.  Musculoskeletal: Negative for back pain and joint  pain.  Skin: Negative.  Negative for itching and rash.  Neurological: Positive for tingling. Negative for dizziness, focal weakness, weakness and headaches.  Endo/Heme/Allergies: Does not bruise/bleed easily.  Psychiatric/Behavioral: Negative for depression. The patient is not nervous/anxious and does not have insomnia.      PAST MEDICAL HISTORY :  Past Medical History:  Diagnosis Date  . Abnormal findings on esophagogastroduodenoscopy (EGD)   . Benign neoplasm of colon   . Cancer [facility])    Rectal   . Coronary artery disease   . DDD (degenerative disc disease), cervical   . DDD (degenerative disc disease), lumbosacral   . Diverticulosis   . Fundic gland polyps of stomach, benign   . GERD (gastroesophageal reflux disease)   . Hemorrhage of rectum and anus   . History of colorectal malignant neoplasm   . History of hiatal hernia   . Hyperlipemia   . Hypertension   . Pneumothorax, spontaneous, tension   . Rectal cancer ([place]) 09/11/2014   colon    PAST SURGICAL HISTORY :   Past Surgical History:  Procedure Laterality Date  . ABDOMINOPERINEAL PROCTOCOLECTOMY  10/18/11  . COLON SURGERY    . COLONOSCOPY N/A 12/23/2014   Procedure: COLONOSCOPY;  Surgeon: Lollie Sails, MD;  Location: [facility] ENDOSCOPY;  Service: Endoscopy;  Laterality: N/A;  . COLONOSCOPY WITH PROPOFOL N/A 02/23/2018   Procedure: COLONOSCOPY WITH PROPOFOL;  Surgeon: Lollie Sails, MD;  Location: [facility] ENDOSCOPY;  Service: Endoscopy;  Laterality: N/A;  . COLONOSCOPY WITH PROPOFOL N/A 07/29/2019   Procedure: COLONOSCOPY WITH PROPOFOL;  Surgeon: Toledo, Benay Pike, MD;  Location: [facility] ENDOSCOPY;  Service: Gastroenterology;  Laterality: N/A;  . Colostomy Placement    .  INSERTION OF MESH N/A 03/17/2017   Procedure: INSERTION OF MESH;  Surgeon: Leonie Green, MD;  Location: [facility] ORS;  Service: General;  Laterality: N/A;  . PARASTOMAL HERNIA REPAIR N/A 03/17/2017   Procedure: HERNIA REPAIR PARASTOMAL;  Surgeon: Leonie Green, MD;  Location: [facility] ORS;  Service: General;  Laterality: N/A;    FAMILY HISTORY :   Family History  Problem Relation Age of Onset  . Colon cancer Father     SOCIAL HISTORY:   Social History   Tobacco Use  . Smoking status: Never Smoker  . Smokeless tobacco: Never Used  Vaping Use  . Vaping Use: Never used  Substance Use Topics  . Alcohol use: No  . Drug use: No    ALLERGIES:  is allergic to aloxi [palonosetron hcl], decadron [dexamethasone], emend [aprepitant], hydrocodone-acetaminophen, and penicillin v potassium.  MEDICATIONS:  Current Outpatient Medications  Medication Sig Dispense Refill  . diphenhydrAMINE HCl, Sleep, (SLEEP-AID) 25 MG CAPS Take 1 capsule by mouth at bedtime as needed (sleep).    Marland Kitchen lisinopril-hydrochlorothiazide (PRINZIDE,ZESTORETIC) 10-12.5 MG per tablet Take 1 tablet by mouth once daily    . simethicone (MYLICON) 80 MG chewable tablet Chew 80 mg by mouth every 6 (six) hours as needed for flatulence.      No current facility-administered medications for this visit.    PHYSICAL EXAMINATION: ECOG PERFORMANCE STATUS: 0 - Asymptomatic  BP (!) 130/92   Pulse 87   Temp (!) 97.2 F (36.2 C) (Tympanic)   Resp 20   Wt 184 lb 3.2 oz (83.6 kg)   BMI 27.20 kg/m   Filed Weights   01/28/20 1022  Weight: 184 lb 3.2 oz (83.6 kg)    Physical Exam HENT:     Head: Normocephalic and atraumatic.     Mouth/Throat:     Pharynx: No oropharyngeal exudate.  Eyes:     Pupils: Pupils are equal, round, and reactive to light.  Cardiovascular:     Rate and Rhythm: Normal rate and regular rhythm.  Pulmonary:     Effort: No respiratory distress.     Breath sounds: No wheezing.  Abdominal:     General: Bowel sounds are normal. There is no distension.     Palpations: Abdomen is soft. There is no mass.     Tenderness: There is no abdominal tenderness. There is no guarding or rebound.     Comments: Colostomy bag noted.  Musculoskeletal:         General: No tenderness. Normal range of motion.     Cervical back: Normal range of motion and neck supple.  Skin:    General: Skin is warm.  Neurological:     Mental Status: He is alert and oriented to person, place, and time.  Psychiatric:        Mood and Affect: Affect normal.      LABORATORY DATA:  I have reviewed the data as listed    Component Value Date/Time   NA 136 01/28/2020 0953   K 3.9 01/28/2020 0953   CL 101 01/28/2020 0953   CO2 23 01/28/2020 0953   GLUCOSE 97 01/28/2020 0953   BUN 22 01/28/2020 0953   CREATININE 0.84 01/28/2020 0953   CREATININE 1.01 03/15/2012 0950   CALCIUM 9.2 01/28/2020 0953   PROT 7.1 01/28/2020 0953   PROT 7.2 11/10/2011 1010   ALBUMIN 4.2 01/28/2020 0953   ALBUMIN 3.8 11/10/2011 1010   AST 23 01/28/2020 0953   AST 17 11/10/2011 1010  ALT 21 01/28/2020 0953   ALT 22 11/10/2011 1010   ALKPHOS 42 01/28/2020 0953   ALKPHOS 69 11/10/2011 1010   BILITOT 1.1 01/28/2020 0953   BILITOT 0.5 11/10/2011 1010   GFRNONAA >60 01/28/2020 0953   GFRNONAA >60 03/15/2012 0950   GFRAA >60 01/28/2019 1011   GFRAA >60 03/15/2012 0950    No results found for: SPEP, UPEP  Lab Results  Component Value Date   WBC 6.7 01/28/2020   NEUTROABS 4.8 01/28/2020   HGB 16.4 01/28/2020   HCT 48.8 01/28/2020   MCV 87.6 01/28/2020   PLT 211 01/28/2020      Chemistry      Component Value Date/Time   NA 136 01/28/2020 0953   K 3.9 01/28/2020 0953   CL 101 01/28/2020 0953   CO2 23 01/28/2020 0953   BUN 22 01/28/2020 0953   CREATININE 0.84 01/28/2020 0953   CREATININE 1.01 03/15/2012 0950      Component Value Date/Time   CALCIUM 9.2 01/28/2020 0953   ALKPHOS 42 01/28/2020 0953   ALKPHOS 69 11/10/2011 1010   AST 23 01/28/2020 0953   AST 17 11/10/2011 1010   ALT 21 01/28/2020 0953   ALT 22 11/10/2011 1010   BILITOT 1.1 01/28/2020 0953   BILITOT 0.5 11/10/2011 1010       RADIOGRAPHIC STUDIES: I have personally reviewed the radiological  images as listed and agreed with the findings in the report. No results found.  IMPRESSION: 1. No evidence of local rectal carcinoma recurrence the pelvis. 2. No evidence of metastatic disease. 3. Large peristomal hernia along the LEFT lower quadrant colostomy without evidence of obstruction.   Electronically Signed   By: Suzy Bouchard M.D.   On: 01/23/2017 13:14  ASSESSMENT & PLAN:  Rectal cancer ([place]) #  Rectal cancer-stage II [2011]- DEC 2018- PET scan-no evidence of local recurrence or distant metastatic disease.s/p surveillaince colo in Jan 2020 [Dr.Skulskie]- multiple polyps s/p resection.  Recommend continued surveillance colonoscopies.  No role for any surveillance imaging.  # Abdominal discomfort- Likely secondary to parastomal hernia-status post surgery- STABLE.   #Personal/familial predisposition to malignancy -rectal cancer- father in 73s; personal rectal cancer-59. 2 sisters [younger]; no biological- children. ? Mom- Discussed genetic counselling at length- personal/family; wants to wait.  He will just wait on it/patient is not interested.  # Peripheral Neuropathy- G-2; STABLE. Reluctant to try any medications; discussed re: acupuncture; not interested.   #Regards to follow-up-discussed that patient can follow-up with PCP; and he does not need any surveillance imaging or tumor markers at this time.  We will need to get surveillance colonoscopies as recommended by GI.  # DISPOSITION:  # follow up as needed-Dr.B  Cc; Dr.Toledo/[place]; Dr.Sparks.    No orders of the defined types were placed in this encounter.  All questions were answered. The patient knows to call the clinic with any problems, questions or concerns.      Cammie Sickle, MD 01/28/2020 12:49 PM

## 2020-01-29 LAB — CEA: CEA: 4.3 ng/mL (ref 0.0–4.7)

## 2020-05-06 ENCOUNTER — Other Ambulatory Visit: Payer: Self-pay

## 2020-05-06 ENCOUNTER — Ambulatory Visit: Payer: Medicare Other | Admitting: Anesthesiology

## 2020-05-06 ENCOUNTER — Ambulatory Visit
Admission: RE | Admit: 2020-05-06 | Discharge: 2020-05-06 | Disposition: A | Discharge status: Home / Self Care | Payer: Medicare Other | Source: Ambulatory Visit | Attending: Internal Medicine | Admitting: Internal Medicine

## 2020-05-06 ENCOUNTER — Encounter
Admission: RE | Disposition: A | Discharge status: Home / Self Care | Payer: Self-pay | Source: Ambulatory Visit | Attending: Internal Medicine

## 2020-05-06 DIAGNOSIS — X58XXXA Exposure to other specified factors, initial encounter: Secondary | ICD-10-CM | POA: Diagnosis not present

## 2020-05-06 DIAGNOSIS — Z880 Allergy status to penicillin: Secondary | ICD-10-CM | POA: Insufficient documentation

## 2020-05-06 DIAGNOSIS — T18128A Food in esophagus causing other injury, initial encounter: Secondary | ICD-10-CM | POA: Diagnosis not present

## 2020-05-06 DIAGNOSIS — K317 Polyp of stomach and duodenum: Secondary | ICD-10-CM | POA: Insufficient documentation

## 2020-05-06 DIAGNOSIS — Z933 Colostomy status: Secondary | ICD-10-CM | POA: Diagnosis not present

## 2020-05-06 DIAGNOSIS — Z885 Allergy status to narcotic agent status: Secondary | ICD-10-CM | POA: Insufficient documentation

## 2020-05-06 DIAGNOSIS — Z888 Allergy status to other drugs, medicaments and biological substances status: Secondary | ICD-10-CM | POA: Diagnosis not present

## 2020-05-06 DIAGNOSIS — Z79899 Other long term (current) drug therapy: Secondary | ICD-10-CM | POA: Diagnosis not present

## 2020-05-06 DIAGNOSIS — R1314 Dysphagia, pharyngoesophageal phase: Secondary | ICD-10-CM | POA: Insufficient documentation

## 2020-05-06 DIAGNOSIS — K222 Esophageal obstruction: Secondary | ICD-10-CM | POA: Diagnosis not present

## 2020-05-06 DIAGNOSIS — K219 Gastro-esophageal reflux disease without esophagitis: Secondary | ICD-10-CM | POA: Diagnosis not present

## 2020-05-06 DIAGNOSIS — Z800 Family history of malignant neoplasm of digestive organs: Secondary | ICD-10-CM | POA: Insufficient documentation

## 2020-05-06 DIAGNOSIS — Z7982 Long term (current) use of aspirin: Secondary | ICD-10-CM | POA: Insufficient documentation

## 2020-05-06 DIAGNOSIS — Z8249 Family history of ischemic heart disease and other diseases of the circulatory system: Secondary | ICD-10-CM | POA: Insufficient documentation

## 2020-05-06 DIAGNOSIS — Z85048 Personal history of other malignant neoplasm of rectum, rectosigmoid junction, and anus: Secondary | ICD-10-CM | POA: Diagnosis not present

## 2020-05-06 HISTORY — PX: ESOPHAGOGASTRODUODENOSCOPY: SHX5428

## 2020-05-06 SURGERY — EGD (ESOPHAGOGASTRODUODENOSCOPY)
Anesthesia: General

## 2020-05-06 MED ORDER — SODIUM CHLORIDE 0.9 % IV SOLN: INTRAVENOUS | Status: DC

## 2020-05-06 MED ORDER — LIDOCAINE HCL (CARDIAC) PF 100 MG/5ML IV SOSY
PREFILLED_SYRINGE | INTRAVENOUS | Status: DC | PRN
  Administered 2020-05-06: 100 mg via INTRAVENOUS

## 2020-05-06 MED ORDER — ROCURONIUM BROMIDE 100 MG/10ML IV SOLN
INTRAVENOUS | Status: DC | PRN
  Administered 2020-05-06: 10 mg via INTRAVENOUS
  Administered 2020-05-06: 40 mg via INTRAVENOUS

## 2020-05-06 MED ORDER — LACTATED RINGERS IV SOLN
INTRAVENOUS | Status: DC
  Administered 2020-05-06: 20 mL/h via INTRAVENOUS

## 2020-05-06 MED ORDER — FENTANYL CITRATE (PF) 100 MCG/2ML IJ SOLN: 25.0000 ug | INTRAMUSCULAR | Status: DC | PRN

## 2020-05-06 MED ORDER — FENTANYL CITRATE (PF) 100 MCG/2ML IJ SOLN
INTRAMUSCULAR | Status: DC | PRN
  Administered 2020-05-06: 50 ug via INTRAVENOUS

## 2020-05-06 MED ORDER — ONDANSETRON HCL 4 MG/2ML IJ SOLN: 4.0000 mg | Freq: Once | INTRAMUSCULAR | Status: AC

## 2020-05-06 MED ORDER — ONDANSETRON HCL 4 MG/2ML IJ SOLN
INTRAMUSCULAR | Status: DC | PRN
  Administered 2020-05-06: 4 mg via INTRAVENOUS

## 2020-05-06 MED ORDER — SUGAMMADEX SODIUM 200 MG/2ML IV SOLN
INTRAVENOUS | Status: DC | PRN
  Administered 2020-05-06: 200 mg via INTRAVENOUS

## 2020-05-06 MED ORDER — SUCCINYLCHOLINE CHLORIDE 20 MG/ML IJ SOLN
INTRAMUSCULAR | Status: DC | PRN
  Administered 2020-05-06: 120 mg via INTRAVENOUS

## 2020-05-06 MED ORDER — PROPOFOL 10 MG/ML IV BOLUS
INTRAVENOUS | Status: DC | PRN
  Administered 2020-05-06: 150 mg via INTRAVENOUS

## 2020-05-06 MED ORDER — ONDANSETRON HCL 4 MG/2ML IJ SOLN
INTRAMUSCULAR | Status: AC
  Administered 2020-05-06: 4 mg via INTRAVENOUS
  Filled 2020-05-06: qty 2

## 2020-05-06 MED ORDER — FENTANYL CITRATE (PF) 100 MCG/2ML IJ SOLN
INTRAMUSCULAR | Status: AC
  Filled 2020-05-06: qty 2

## 2020-05-06 NOTE — H&P (Signed)
HISTORY OF PRESENT ILLNESS   Mr. Nabers presents to the GI clinic today at the request of Mr. Mortimer Fries, PA-C. He presented to his PCP today for acute visit of esophageal dysphagia. Patient reports he was eating chicken casserole (chicken and cabbage) around noon this afternoon. He reports with one the first few bites he developed acute dysphagia localized to the mid sternum. He reports he has since been unable to tolerate any oral intake, both solids and liquids. He has attempted to drink water and reports it will reach the level of the mid sternum and then regurgitate. He reports he has a lot of substernal pain. He is able to tolerate his own secretions. He denies any chest pain, shortness of breath, or near syncopal episodes. He does report a history of acid reflux for which he currently takes OTC antacid on an as-needed basis. He reports he is never taken daily acid suppression therapy. He denies any nausea, early satiety, unintentional weight loss, hoarseness, abdominal pain, or changes in bowel habits. He reports he has been spitting up saliva. He has been unable to regurgitate the food bolus. He has been unable to tolerate any liquids or solids since the episode.  Problem List:  Problem List Date Reviewed: 12/19/2019  Noted  Heart palpitations 05/06/2020  Ventral hernia 03/17/2017  Preop cardiovascular exam 03/09/2017  Rectal cancer (CMS-HCC) 09/11/2014  Overview  Last Assessment & Plan:  # Rectal cancer-DEC 2018- PET scan-no evidence of local recurrence or distant metastatic disease. Continue surveillaince colo in Jan 2020 [Dr.Skulskie]  # Abdominal discomfort- Likely secondary to parastomal hernia-status post surgery-currently improved. No concerns for recurrence. Surveillance as above.  # rectal cancer- father in 61s; personal rectal cancer-58. 2 sisters [younger]; no children. ? Mom- colon cancer?. Will discuss genetic testing at next visit  # Peripheral Neuropathy- G-2; stable.  Reluctant to try any medications; discussed re: acupuncture; not interested.   # DISPOSITION:  # follow up in 12 monmts/labs -cbc/cmp/CEA-Dr.B  Cc; Dr.Smith   Hyperlipidemia 07/03/2013  Disc disease, degenerative, lumbar or lumbosacral Unknown  Overview  with spinal stenosis   Esophageal reflux Unknown  Essential hypertension, benign Unknown  Benign neoplasm of colon Unknown  CAD (coronary artery disease) Unknown  Spontaneous pneumothorax Unknown  Other symptoms involving digestive system(787.99) Unknown  History of colorectal malignant neoplasm Unknown  Overview  with colostomy placement 2010   Hemorrhage of rectum and anus Unknown  Hiatal hernia Unknown    Medications:  Current Outpatient Medications  Medication Sig Dispense Refill  . aspirin 81 MG EC tablet Take 1 tablet (81 mg total) by mouth once daily (Patient not taking: Reported on 03/04/2020 )  . colostomy bags 2 1/2 " Misc Use 1 Bag once daily. 20 each 11  . doxylamine succinate (UNISOM, DOXYLAMINE, ORAL) Take by mouth  . lisinopriL-hydrochlorothiazide (ZESTORETIC) 10-12.5 mg tablet Take 1 tablet by mouth once daily 90 tablet 3  . propranoloL (INDERAL) 10 MG tablet Take 1 tablet (10 mg total) by mouth 2 (two) times daily (Patient not taking: Reported on 03/04/2020 ) 60 tablet 5  . simethicone (MYLICON) 80 MG chewable tablet Take 80 mg by mouth every 6 (six) hours as needed for Flatulence   No current facility-administered medications for this visit.    Allergies:  Aprepitant, Dexamethasone, Hydrocodone-acetaminophen, Palonosetron hcl, and Penicillin v potassium  Past Medical History:  Past Medical History:  Diagnosis Date  . 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 Benign neoplasm of colon  . CAD (  coronary artery disease)  . Degenerative disc disease  with spinal stenosis  . Diverticulosis 10/18/2011, 10/20/2009, 06/04/2008  . Esophageal reflux  . Essential hypertension, benign  .  Fundic gland polyps of stomach, benign 06/04/2008  . GERD (gastroesophageal reflux disease)  . Hemorrhage of rectum and anus  . HH (hiatus hernia) 06/04/2008  . Hiatal hernia  . History of colorectal malignant neoplasm  with colostomy placement 2010  . Hyperplastic colon polyp 10/18/2011, 06/04/2008  . Other symptoms involving digestive system(787.99)  . Rectal mass 06/04/2008  . Spontaneous pneumothorax  . Tubular adenoma of colon, unspecified 10/18/2011, 06/04/2008  . 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 Marital status: Married  Spouse name: Not on file  . Number of children: Not on file  . Years of education: Not on file  . Highest education level: Not on file  Occupational History  . Not on file  Tobacco Use  . Smoking status: Never Smoker  . Smokeless tobacco: Never Used  Vaping Use  . Vaping Use: Never used  Substance and Sexual Activity  . Alcohol use: No  Alcohol/week: 0.0 standard drinks  . Drug use: No  . Sexual activity: Defer  Other Topics Concern  . Not on file  Social History Narrative  . Not on file   Social Determinants of Health   Financial Resource Strain: Not on file  Food Insecurity: Not on file  Transportation Needs: Not on file   Family History:  Family History  Problem Relation Age of Onset  . Colon polyps Father  . Colon cancer Father 19  . Prostate cancer Other  . Coronary Artery Disease (Blocked arteries around heart) Other   Review of Systems   General: no fever, chills, weight gain, weight loss, fatigue, weakness, depression, anxiety Head: no injury, migraines, headaches Eyes: no jaundice, itching, dryness, tearing, redness, vision changes Nose: no injury, bleeding Mouth/Throat: no oral ulcers, swollen neck, dry mouth, sore throat, hoarseness Endocrine: no heat/cold intolerance Respiratory: no cough, wheezing, shortness of breath Cardiovascular: no chest  pain, palpitations GI: see HPI Musculoskeletal: no joint swelling, muscle/joint pain  Neurological: no seizures, syncope, dizziness, numbness/tingling Skin: no rashes, itching Hematological and Lymphatic: no easy bruising, easy bleeding  Physical Examination   BP 144/80 HR 115  Wt Readings from Last 3 Encounters:  05/06/20 83.9 kg (185 lb)  03/04/20 84.2 kg (185 lb 9.6 oz)  12/19/19 84.4 kg (186 lb)   General Appearance: Mild distress, no accessory muscle use upon breathing. Answers all questions appropriately.  Head: Atraumatic, normocephalic  Eyes: Anciteric  Neck: No lymphadenopathy noted, symmetrical, no JVD  Mouth: Lips, mucosa, and tongue normal;  Lungs: Clear to auscultation bilaterally, no wheezes/crackles/rales  Heart: Regular rate and rhythm, S1 and S2 normal, no murmur, rub or gallop  Abdomen:  Rectal: Soft, non-tender, bowel sounds active all four quadrants, no rebound/guarding, no masses, no organomegaly Deferred  Extremities: Extremities normal, atraumatic, no cyanosis or edema  Skin: No abdominal rashes or lesions noted  Neurologic: Moves all extremities well. Alert and oriented x 3.   Review of Data   I have personally reviewed and interpreted the following data today:  Labs: Office Visit on 05/06/2020  Component Date Value  . Vent Rate (bpm) 05/06/2020 106  . PR Interval (msec) 05/06/2020 126  . QRS Interval (msec) 05/06/2020 84  . QT Interval (msec) 05/06/2020 324  . QTc (msec) 05/06/2020 430  Office Visit on  03/04/2020  Component Date Value  . Vent Rate (bpm) 03/04/2020 98  . PR Interval (msec) 03/04/2020 134  . QRS Interval (msec) 03/04/2020 86  . QT Interval (msec) 03/04/2020 348  . QTc (msec) 03/04/2020 444   Colonoscopy 02/23/2018 - pandiverticulosis, fragments of SSAs and tubular adenoma. Overdue for surveillance  Assessment/Plan   70 y/o Caucasian male with a PMH of CAD, DDD, GERD, history of colorectal malignant neoplasm s/p colostomy  placement 2010 presents to the clinic for chief complaint of acute esophageal dysphagia 2/2 esophageal food impaction  1. Esophageal food obstruction 2/2 food impaction  2. GERD - not well-controlled, not on any current acid suppression therapy  3. Personal Hx of adenomatous colon polyps - overdue for surveillance. Last colonoscopy 02/2018.   4. Personal Hx of colorectal cancer s/p colostomy placement 2010  -Patient seen and examined this afternoon downstairs in internal medicine. Patient appears uncomfortable, but with stable vital signs and able to answer all questions appropriately. He is able to tolerate his own secretions without difficulty. His clinical history is highly consistent with esophageal food obstruction 2/2 food impaction. We discussed recommendation to have urgent endoscopy with endoscopic removal of food bolus and potential esophageal dilatation of likely stricture. We discussed procedure details and indications in office today. He consents to proceed. -We will obtain stat Covid test and stat chest x-ray -Plan for urgent EGD this afternoon with Dr. [name] -See procedure note for findings and further recommendations  I reviewed the risks (including bleeding, perforation, infection, anesthesia complications, cardiac/respiratory complications), benefits and alternatives of EGD. Patient consents to proceed. Denies CP/SOB/blood thinners.   Robet Leu, M.D. ABIM Diplomate in Gastroenterology [place]

## 2020-05-06 NOTE — Progress Notes (Signed)
Pt c/o nausea. Dr. Amie Critchley notified. Acknowledged. Orders received.

## 2020-05-06 NOTE — Interval H&P Note (Signed)
History and Physical Interval Note:  05/06/2020 6:16 PM  Jeffrey Roberson  has presented today for surgery, with the diagnosis of Food Bolus.  The various methods of treatment have been discussed with the patient and family. After consideration of risks, benefits and other options for treatment, the patient has consented to  Procedure(s): ESOPHAGOGASTRODUODENOSCOPY (EGD) (N/A) as a surgical intervention.  The patient's history has been reviewed, patient examined, no change in status, stable for surgery.  I have reviewed the patient's chart and labs.  Questions were answered to the patient's satisfaction.     [place], [place]

## 2020-05-06 NOTE — Transfer of Care (Signed)
Immediate Anesthesia Transfer of Care Note  Patient: Jeffrey Roberson  Procedure(s) Performed: ESOPHAGOGASTRODUODENOSCOPY (EGD) (N/A )  Patient Location: PACU  Anesthesia Type:General  Level of Consciousness: awake, alert  and oriented  Airway & Oxygen Therapy: Patient Spontanous Breathing and Patient connected to face mask oxygen  Post-op Assessment: Report given to RN and Post -op Vital signs reviewed and stable  Post vital signs: Reviewed and stable  Last Vitals:  Vitals Value Taken Time  BP    Temp    Pulse 100 05/06/20 1858  Resp 18 05/06/20 1858  SpO2 96 % 05/06/20 1858  Vitals shown include unvalidated device data.  Last Pain:  Vitals:   05/06/20 1744  TempSrc: Temporal  PainSc: 0-No pain         Complications: No complications documented.

## 2020-05-06 NOTE — Anesthesia Procedure Notes (Signed)
Procedure Name: Intubation Date/Time: 05/06/2020 6:40 PM Performed by: Nelda Marseille, CRNA Pre-anesthesia Checklist: Patient identified, Patient being monitored, Timeout performed, Emergency Drugs available and Suction available Patient Re-evaluated:Patient Re-evaluated prior to induction Oxygen Delivery Method: Circle system utilized Preoxygenation: Pre-oxygenation with 100% oxygen Induction Type: IV induction Ventilation: Mask ventilation without difficulty Laryngoscope Size: Mac, 3 and McGraph Grade View: Grade I Tube type: Oral Tube size: 7.5 mm Number of attempts: 1 Airway Equipment and Method: Stylet Placement Confirmation: ETT inserted through vocal cords under direct vision,  positive ETCO2 and breath sounds checked- equal and bilateral Secured at: 21 cm Tube secured with: Tape Dental Injury: Teeth and Oropharynx as per pre-operative assessment

## 2020-05-06 NOTE — Anesthesia Preprocedure Evaluation (Addendum)
Anesthesia Evaluation  Patient identified by MRN, date of birth, ID band Patient awake    Reviewed: Allergy & Precautions, H&P , NPO status , Patient's Chart, lab work & pertinent test results  History of Anesthesia Complications Negative for: history of anesthetic complications  Airway Mallampati: II  TM Distance: >3 FB Neck ROM: full    Dental  (+) Chipped   Pulmonary neg pulmonary ROS, neg shortness of breath,    Pulmonary exam normal        Cardiovascular Exercise Tolerance: Good hypertension, (-) angina+ CAD  Normal cardiovascular exam     Neuro/Psych negative neurological ROS  negative psych ROS   GI/Hepatic Neg liver ROS, hiatal hernia, GERD  Medicated and Controlled,  Endo/Other  negative endocrine ROS  Renal/GU      Musculoskeletal  (+) Arthritis ,   Abdominal   Peds  Hematology negative hematology ROS (+)   Anesthesia Other Findings Past Medical History: No date: Abnormal findings on esophagogastroduodenoscopy (EGD) No date: Benign neoplasm of colon No date: Cancer (HCC)     Comment:  Rectal  No date: Coronary artery disease No date: DDD (degenerative disc disease), cervical No date: DDD (degenerative disc disease), lumbosacral No date: Diverticulosis No date: Fundic gland polyps of stomach, benign No date: GERD (gastroesophageal reflux disease) No date: Hemorrhage of rectum and anus No date: History of colorectal malignant neoplasm No date: History of hiatal hernia No date: Hyperlipemia No date: Hypertension No date: Pneumothorax, spontaneous, tension 09/11/2014: Rectal cancer ([place])     Comment:  colon  Past Surgical History: 10/18/11: ABDOMINOPERINEAL PROCTOCOLECTOMY No date: COLON SURGERY 12/23/2014: COLONOSCOPY; N/A     Comment:  Procedure: COLONOSCOPY;  Surgeon: Lollie Sails, MD;              Location: [facility] ENDOSCOPY;  Service: Endoscopy;                Laterality:  N/A; 02/23/2018: COLONOSCOPY WITH PROPOFOL; N/A     Comment:  Procedure: COLONOSCOPY WITH PROPOFOL;  Surgeon:               Lollie Sails, MD;  Location: [facility] ENDOSCOPY;                Service: Endoscopy;  Laterality: N/A; 07/29/2019: COLONOSCOPY WITH PROPOFOL; N/A     Comment:  Procedure: COLONOSCOPY WITH PROPOFOL;  Surgeon: Tol[place],               Benay Pike, MD;  Location: [facility] ENDOSCOPY;  Service:               Gastroenterology;  Laterality: N/A; No date: Colostomy Placement 03/17/2017: INSERTION OF MESH; N/A     Comment:  Procedure: INSERTION OF MESH;  Surgeon: Leonie Green, MD;  Location: [facility] ORS;  Service: General;                Laterality: N/A; 03/17/2017: PARASTOMAL HERNIA REPAIR; N/A     Comment:  Procedure: HERNIA REPAIR PARASTOMAL;  Surgeon: Leonie Green, MD;  Location: [facility] ORS;  Service:               General;  Laterality: N/A;     Reproductive/Obstetrics negative OB ROS  Anesthesia Physical Anesthesia Plan  ASA: III  Anesthesia Plan: General ETT, Rapid Sequence and Cricoid Pressure   Post-op Pain Management:    Induction: Intravenous  PONV Risk Score and Plan: Ondansetron, Dexamethasone, Midazolam and Treatment may vary due to age or medical condition  Airway Management Planned: Oral ETT  Additional Equipment:   Intra-op Plan:   Post-operative Plan: Extubation in OR  Informed Consent: I have reviewed the patients History and Physical, chart, labs and discussed the procedure including the risks, benefits and alternatives for the proposed anesthesia with the patient or authorized representative who has indicated his/her understanding and acceptance.     Dental Advisory Given  Plan Discussed with: Anesthesiologist, CRNA and Surgeon  Anesthesia Plan Comments: (Patient consented for risks of anesthesia including but not limited to:  - adverse reactions to  medications - damage to eyes, teeth, lips or other oral mucosa - nerve damage due to positioning  - sore throat or hoarseness - Damage to heart, brain, nerves, lungs, other parts of body or loss of life  Patient voiced understanding.)       Anesthesia Quick Evaluation

## 2020-05-06 NOTE — Op Note (Signed)
[facility] Gastroenterology Patient Name: Jeffrey Roberson Procedure Date: 05/06/2020 6:30 PM MRN: [id number] Account #: [id number] Date of Birth: [birth year] Admit Type: Outpatient Age: 70 Room: [facility] ENDO ROOM 4 Gender: Male Note Status: Finalized Procedure:             Upper GI endoscopy Indications:           Removal of foreign body in the esophagus, Dysphagia Providers:             Benay Pike. Ashlin Hidalgo MD, MD Medicines:             Propofol per Anesthesia Complications:         No immediate complications. Estimated blood loss:                         Minimal. Procedure:             Pre-Anesthesia Assessment:                        - Prior to the procedure, a History and Physical was                         performed, and patient medications, allergies and                         sensitivities were reviewed. The patient's tolerance                         of previous anesthesia was reviewed.                        - The risks and benefits of the procedure and the                         sedation options and risks were discussed with the                         patient. All questions were answered and informed                         consent was obtained.                        - Patient identification and proposed procedure were                         verified prior to the procedure by the nurse. The                         procedure was verified in the procedure room.                        - ASA Grade Assessment: III - A patient with severe                         systemic disease.                        - After reviewing the risks and benefits, the patient  was deemed in satisfactory condition to undergo the                         procedure.                        After obtaining informed consent, the endoscope was                         passed under direct vision. Throughout the procedure,                         the patient's  blood pressure, pulse, and oxygen                         saturations were monitored continuously. The Endoscope                         was introduced through the mouth, and advanced to the                         third part of duodenum. The upper GI endoscopy was                         somewhat difficult due to presence of foreign body.                         Successful completion of the procedure was aided by                         performing the maneuvers documented (below) in this                         report. The patient tolerated the procedure well. Findings:      Food was found in the lower third of the esophagus. Removal of food was       accomplished.      Localized moderate mucosal changes characterized by concentric rings       with prominent stricture at the GE junction were found in the lower       third of the esophagus. Biopsies were taken with a cold forceps for       histology.      One benign-appearing, intrinsic moderate stenosis was found at the       gastroesophageal junction. This stenosis measured 1.4 cm (inner       diameter) x less than one cm (in length). The stenosis was traversed.       Biopsies were taken with a cold forceps for histology.      Multiple small pedunculated and sessile polyps with no bleeding and no       stigmata of recent bleeding were found in the gastric body.      The examined duodenum was normal.      The exam was otherwise without abnormality. Impression:            - Food in the lower third of the esophagus. Removal                         was successful.                        -  Concentric rings with prominent stricture at the GE                         junction mucosa in the esophagus. Biopsied.                        - Benign-appearing esophageal stenosis. Biopsied.                        - Multiple gastric polyps.                        - Normal examined duodenum.                        - The examination was otherwise  normal. Recommendation:        - Patient has a contact number available for                         emergencies. The signs and symptoms of potential                         delayed complications were discussed with the patient.                         Return to normal activities tomorrow. Written                         discharge instructions were provided to the patient.                        - Full liquid diet for 1 day.                        - Advance diet as tolerated thereafter.                        - Use Protonix (pantoprazole) 40 mg PO daily. I will                         call in prescription to pharmacy.                        - Await pathology results. Discharge to home today.                        - Return to GI office in 4 weeks.                        - The findings and recommendations were discussed with                         the patient. Procedure Code(s):     --- Professional ---                        [phone], Esophagogastroduodenoscopy, flexible,                         transoral; with removal of foreign body(s)  [id number], Esophagogastroduodenoscopy, flexible,                         transoral; with biopsy, single or multiple Diagnosis Code(s):     --- Professional ---                        R13.10, Dysphagia, unspecified                        T18.108A, Unspecified foreign body in esophagus                         causing other injury, initial encounter                        K31.7, Polyp of stomach and duodenum                        K22.2, Esophageal obstruction                        P59.163W, Food in esophagus causing other injury,                         initial encounter CPT copyright 2019 American Medical Association. All rights reserved. The codes documented in this report are preliminary and upon coder review may  be revised to meet current compliance requirements. Efrain Sella MD, MD 05/06/2020 6:58:56 PM This report has been  signed electronically. Number of Addenda: 0 Note Initiated On: 05/06/2020 6:30 PM Estimated Blood Loss:  Estimated blood loss was minimal.      [facility]

## 2020-05-07 ENCOUNTER — Encounter: Payer: Self-pay | Admitting: Internal Medicine

## 2020-05-07 NOTE — Anesthesia Postprocedure Evaluation (Signed)
Anesthesia Post Note  Patient: Jeffrey Roberson  Procedure(s) Performed: ESOPHAGOGASTRODUODENOSCOPY (EGD) (N/A )  Patient location during evaluation: PACU Anesthesia Type: General Level of consciousness: awake and alert Pain management: pain level controlled Vital Signs Assessment: post-procedure vital signs reviewed and stable Respiratory status: spontaneous breathing, nonlabored ventilation, respiratory function stable and patient connected to nasal cannula oxygen Cardiovascular status: blood pressure returned to baseline and stable Postop Assessment: no apparent nausea or vomiting Anesthetic complications: no   No complications documented.   Last Vitals:  Vitals:   05/06/20 1921 05/06/20 1926  BP:  136/88  Pulse: (!) 102 100  Resp: 19 16  Temp:  36.8 C  SpO2: 98% 98%    Last Pain:  Vitals:   05/06/20 1926  TempSrc:   PainSc: 0-No pain                 Precious Haws Curran Lenderman

## 2020-05-08 LAB — SURGICAL PATHOLOGY

## 2020-11-11 ENCOUNTER — Emergency Department: Payer: Medicare Other

## 2020-11-11 ENCOUNTER — Emergency Department
Admission: EM | Admit: 2020-11-11 | Discharge: 2020-11-11 | Disposition: A | Discharge status: Home / Self Care | Payer: Medicare Other | Attending: Emergency Medicine | Admitting: Emergency Medicine

## 2020-11-11 ENCOUNTER — Encounter: Payer: Self-pay | Admitting: Emergency Medicine

## 2020-11-11 ENCOUNTER — Other Ambulatory Visit: Payer: Self-pay

## 2020-11-11 DIAGNOSIS — K219 Gastro-esophageal reflux disease without esophagitis: Secondary | ICD-10-CM | POA: Insufficient documentation

## 2020-11-11 DIAGNOSIS — I10 Essential (primary) hypertension: Secondary | ICD-10-CM | POA: Diagnosis not present

## 2020-11-11 DIAGNOSIS — Z79899 Other long term (current) drug therapy: Secondary | ICD-10-CM | POA: Insufficient documentation

## 2020-11-11 DIAGNOSIS — K2970 Gastritis, unspecified, without bleeding: Secondary | ICD-10-CM | POA: Diagnosis not present

## 2020-11-11 DIAGNOSIS — I2510 Atherosclerotic heart disease of native coronary artery without angina pectoris: Secondary | ICD-10-CM | POA: Diagnosis not present

## 2020-11-11 DIAGNOSIS — R1013 Epigastric pain: Secondary | ICD-10-CM

## 2020-11-11 DIAGNOSIS — Z85048 Personal history of other malignant neoplasm of rectum, rectosigmoid junction, and anus: Secondary | ICD-10-CM | POA: Diagnosis not present

## 2020-11-11 DIAGNOSIS — Z85038 Personal history of other malignant neoplasm of large intestine: Secondary | ICD-10-CM | POA: Insufficient documentation

## 2020-11-11 LAB — CBC
HCT: 43.2 % (ref 39.0–52.0)
Hemoglobin: 15.1 g/dL (ref 13.0–17.0)
MCH: 30.9 pg (ref 26.0–34.0)
MCHC: 35 g/dL (ref 30.0–36.0)
MCV: 88.5 fL (ref 80.0–100.0)
Platelets: 221 10*3/uL (ref 150–400)
RBC: 4.88 MIL/uL (ref 4.22–5.81)
RDW: 13.2 % (ref 11.5–15.5)
WBC: 6.8 10*3/uL (ref 4.0–10.5)
nRBC: 0 % (ref 0.0–0.2)

## 2020-11-11 LAB — TYPE AND SCREEN
ABO/RH(D): A POS
Antibody Screen: NEGATIVE

## 2020-11-11 LAB — COMPREHENSIVE METABOLIC PANEL
ALT: 17 U/L (ref 0–44)
AST: 24 U/L (ref 15–41)
Albumin: 3.8 g/dL (ref 3.5–5.0)
Alkaline Phosphatase: 47 U/L (ref 38–126)
Anion gap: 8 (ref 5–15)
BUN: 25 mg/dL — ABNORMAL HIGH (ref 8–23)
CO2: 28 mmol/L (ref 22–32)
Calcium: 9 mg/dL (ref 8.9–10.3)
Chloride: 99 mmol/L (ref 98–111)
Creatinine, Ser: 1.07 mg/dL (ref 0.61–1.24)
GFR, Estimated: >60 mL/min (ref >60)
Glucose, Bld: 130 mg/dL — ABNORMAL HIGH (ref 70–99)
Potassium: 3.2 mmol/L — ABNORMAL LOW (ref 3.5–5.1)
Sodium: 135 mmol/L (ref 135–145)
Total Bilirubin: 1 mg/dL (ref 0.3–1.2)
Total Protein: 6.7 g/dL (ref 6.5–8.1)

## 2020-11-11 MED ORDER — IOHEXOL 350 MG/ML SOLN
80.0000 mL | Freq: Once | INTRAVENOUS | Status: AC | PRN
  Administered 2020-11-11: 80 mL via INTRAVENOUS

## 2020-11-11 MED ORDER — FAMOTIDINE 20 MG PO TABS: 20.0000 mg | ORAL_TABLET | Freq: Two times a day (BID) | ORAL | 0 refills | Status: DC

## 2020-11-11 MED ORDER — SUCRALFATE 1 G PO TABS: 1.0000 g | ORAL_TABLET | Freq: Four times a day (QID) | ORAL | 1 refills | Status: AC

## 2020-11-11 MED ORDER — FAMOTIDINE 20 MG PO TABS: 20.0000 mg | ORAL_TABLET | Freq: Two times a day (BID) | ORAL | 0 refills | Status: AC

## 2020-11-11 MED ORDER — SUCRALFATE 1 G PO TABS: 1.0000 g | ORAL_TABLET | Freq: Four times a day (QID) | ORAL | 1 refills | Status: DC

## 2020-11-11 NOTE — ED Provider Notes (Signed)
[facility] Emergency Department Provider Note  ____________________________________________  Time seen: Approximately 9:22 PM  I have reviewed the triage vital signs and the nursing notes.   HISTORY  Chief Complaint Rectal Bleeding    HPI Jeffrey Roberson is a 70 y.o. male with a history of GERD, diverticulitis, hypertension, rectal cancer with a colostomy who comes ED complaining of dark, loose stool in his colostomy for the past week.  Also has a unpleasant change in smell.  He has been taking simethicone without relief.  He reports mild epigastric pain which is nonradiating, worse after eating.  No alleviating factors.  Waxing and waning.  Does not take any antacids.  No nausea or vomiting, no fever or chills.  He has taken Pepto-Bismol intermittently over the past several days.  He is worried that he might be having a GI bleed.  He does not take any blood thinners.  Wife is also reportedly having a mild diarrheal illness currently.  Past Medical History:  Diagnosis Date   Abnormal findings on esophagogastroduodenoscopy (EGD)    Benign neoplasm of colon    Cancer ([place])    Rectal    Coronary artery disease    DDD (degenerative disc disease), cervical    DDD (degenerative disc disease), lumbosacral    Diverticulosis    Fundic gland polyps of stomach, benign    GERD (gastroesophageal reflux disease)    Hemorrhage of rectum and anus    History of colorectal malignant neoplasm    History of hiatal hernia    Hyperlipemia    Hypertension    Pneumothorax, spontaneous, tension    Rectal cancer ([place]) 09/11/2014   colon     Patient Active Problem List   Diagnosis Date Noted   Ventral hernia 03/17/2017   Acid reflux 09/12/2014   Spontaneous pneumothorax 09/12/2014   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   Rectal cancer ([place]) 09/11/2014     Past Surgical History:  Procedure Laterality Date   ABDOMINOPERINEAL PROCTOCOLECTOMY  10/18/11   COLON  SURGERY     COLONOSCOPY N/A 12/23/2014   Procedure: COLONOSCOPY;  Surgeon: Lollie Sails, MD;  Location: [facility] ENDOSCOPY;  Service: Endoscopy;  Laterality: N/A;   COLONOSCOPY WITH PROPOFOL N/A 02/23/2018   Procedure: COLONOSCOPY WITH PROPOFOL;  Surgeon: Lollie Sails, MD;  Location: [facility] ENDOSCOPY;  Service: Endoscopy;  Laterality: N/A;   COLONOSCOPY WITH PROPOFOL N/A 07/29/2019   Procedure: COLONOSCOPY WITH PROPOFOL;  Surgeon: Toledo, Benay Pike, MD;  Location: [facility] ENDOSCOPY;  Service: Gastroenterology;  Laterality: N/A;   Colostomy Placement     ESOPHAGOGASTRODUODENOSCOPY N/A 05/06/2020   Procedure: ESOPHAGOGASTRODUODENOSCOPY (EGD);  Surgeon: Toledo, Benay Pike, MD;  Location: [facility] ENDOSCOPY;  Service: Gastroenterology;  Laterality: N/A;   INSERTION OF MESH N/A 03/17/2017   Procedure: INSERTION OF MESH;  Surgeon: Leonie Green, MD;  Location: [facility] ORS;  Service: General;  Laterality: N/A;   PARASTOMAL HERNIA REPAIR N/A 03/17/2017   Procedure: HERNIA REPAIR PARASTOMAL;  Surgeon: Leonie Green, MD;  Location: [facility] ORS;  Service: General;  Laterality: N/A;     Prior to Admission medications   Medication Sig Start Date End Date Taking? Authorizing Provider  famotidine (PEPCID) 20 MG tablet Take 1 tablet (20 mg total) by mouth 2 (two) times daily. 11/11/20  Yes Carrie Mew, MD  sucralfate (CARAFATE) 1 g tablet Take 1 tablet (1 g total) by mouth 4 (four) times daily. 11/11/20  Yes Carrie Mew, MD  diphenhydrAMINE HCl, Sleep, (SLEEP-AID) 25 MG CAPS Take 1  capsule by mouth at bedtime as needed (sleep).    [provider]  lisinopril-hydrochlorothiazide (PRINZIDE,ZESTORETIC) 10-12.5 MG per tablet Take 1 tablet by mouth once daily 12/25/13   [provider]  simethicone (MYLICON) 80 MG chewable tablet Chew 80 mg by mouth every 6 (six) hours as needed for flatulence.     [provider]     Allergies Aloxi [palonosetron hcl], Decadron  [dexamethasone], Emend [aprepitant], Hydrocodone-acetaminophen, and Penicillin v potassium   Family History  Problem Relation Age of Onset   Colon cancer Father     Social History Social History   Tobacco Use   Smoking status: Never   Smokeless tobacco: Never  Vaping Use   Vaping Use: Never used  Substance Use Topics   Alcohol use: No   Drug use: No    Review of Systems  Constitutional:   No fever or chills.  ENT:   No sore throat. No rhinorrhea. Cardiovascular:   No chest pain or syncope. Respiratory:   No dyspnea or cough. Gastrointestinal:   Positive for abdominal pain and diarrhea. Musculoskeletal:   Negative for focal pain or swelling All other systems reviewed and are negative except as documented above in ROS and HPI.  ____________________________________________   PHYSICAL EXAM:  VITAL SIGNS: ED Triage Vitals  Enc Vitals Group     BP 11/11/20 1633 (!) 148/94     Pulse Rate 11/11/20 1633 (!) 107     Resp 11/11/20 1633 17     Temp 11/11/20 1633 98.3 F (36.8 C)     Temp Source 11/11/20 1633 Oral     SpO2 11/11/20 1633 96 %     Weight 11/11/20 1630 184 lb 15.5 oz (83.9 kg)     Height 11/11/20 1630 5\' 9"  (1.753 m)     Head Circumference --      Peak Flow --      Pain Score 11/11/20 1630 0     Pain Loc --      Pain Edu? --      Excl. in [place]? --     Vital signs reviewed, nursing assessments reviewed.   Constitutional:   Alert and oriented. Non-toxic appearance. Eyes:   Conjunctivae are normal. EOMI. PERRL. ENT      Head:   Normocephalic and atraumatic.      Nose:   Wearing a mask.      Mouth/Throat:   Wearing a mask.      Neck:   No meningismus. Full ROM. Hematological/Lymphatic/Immunilogical:   No cervical lymphadenopathy. Cardiovascular:   RRR. Symmetric bilateral radial and DP pulses.  No murmurs. Cap refill less than 2 seconds. Respiratory:   Normal respiratory effort without tachypnea/retractions. Breath sounds are clear and equal bilaterally.  No wheezes/rales/rhonchi. Gastrointestinal:   Soft with mild epigastric and left upper quadrant tenderness. Non distended. There is no CVA tenderness.  No rebound, rigidity, or guarding.  Colostomy with air and dark brown stool in the bag, not melanotic.  Colostomy output is Hemoccult negative Genitourinary:   deferred Musculoskeletal:   Normal range of motion in all extremities. No joint effusions.  No lower extremity tenderness.  No edema. Neurologic:   Normal speech and language.  Motor grossly intact. No acute focal neurologic deficits are appreciated.  Skin:    Skin is warm, dry and intact. No rash noted.  No petechiae, purpura, or bullae.  ____________________________________________    LABS (pertinent positives/negatives) (all labs ordered are listed, but only abnormal results are displayed) Labs Reviewed  COMPREHENSIVE METABOLIC PANEL - Abnormal; Notable for the following components:      Result Value   Potassium 3.2 (*)    Glucose, Bld 130 (*)    BUN 25 (*)    All other components within normal limits  CBC  POC OCCULT BLOOD, ED  TYPE AND SCREEN   ____________________________________________   EKG    ____________________________________________    RADIOLOGY  CT ABDOMEN PELVIS W CONTRAST  Result Date: 11/11/2020 CLINICAL DATA:  Abdominal pain. EXAM: CT ABDOMEN AND PELVIS WITH CONTRAST TECHNIQUE: Multidetector CT imaging of the abdomen and pelvis was performed using the standard protocol following bolus administration of intravenous contrast. CONTRAST:  80mL OMNIPAQUE IOHEXOL 350 MG/ML SOLN COMPARISON:  September 13, 2016 FINDINGS: Lower chest: No acute abnormality. Hepatobiliary: An 8.2 cm x 8.5 cm cyst is seen within the anteromedial aspect of the right lobe of the liver. Multiple additional smaller cysts are seen scattered throughout the liver parenchyma. A 1.4 cm x 1.2 cm hyperdense focus is seen within the posterior aspect of the right lobe of the liver. Tortuous  enhancing vessels are seen extending into this region. No gallstones, gallbladder wall thickening, or biliary dilatation. Pancreas: Unremarkable. No pancreatic ductal dilatation or surrounding inflammatory changes. Spleen: Normal in size without focal abnormality. Adrenals/Urinary Tract: Adrenal glands are unremarkable. Kidneys are normal in size, without renal calculi or hydronephrosis. Numerous bilateral parenchymal and parapelvic simple renal cysts are seen. Bladder is unremarkable. Stomach/Bowel: There is a large hiatal hernia. The appendix is not identified. No evidence of bowel wall thickening, distention, or inflammatory changes. Noninflamed diverticula are seen within the descending colon. Vascular/Lymphatic: Aortic atherosclerosis. No enlarged abdominal or pelvic lymph nodes. Reproductive: Prostate gland is mildly enlarged. Other: Postoperative changes are seen with a subsequent left lower quadrant ostomy site. No abdominopelvic ascites. Musculoskeletal: No acute or significant osseous findings. IMPRESSION: 1. Large hiatal hernia. 2. Multiple hepatic and bilateral simple renal cysts. 3. 1.4 cm x 1.2 cm hyperdense focus within the posterior aspect of the right lobe of the liver which may represent a flash filling hemangioma. Correlation with nonemergent follow-up hepatic MRI. 4. Colonic diverticulosis. 5. Findings consistent with history of prior proctocolectomy with subsequent left lower quadrant ostomy site. Aortic Atherosclerosis (ICD10-I70.0). Electronically Signed   By: Virgina Norfolk M.D.   On: 11/11/2020 19:12    ____________________________________________   PROCEDURES Procedures  ____________________________________________  DIFFERENTIAL DIAGNOSIS   Gastritis, occult GI bleed, GI tumor, viral illness  CLINICAL IMPRESSION / ASSESSMENT AND PLAN / ED COURSE  Medications ordered in the ED: Medications  iohexol (OMNIPAQUE) 350 MG/ML injection 80 mL (80 mLs Intravenous Contrast Given  11/11/20 1856)    Pertinent labs & imaging results that were available during my care of the patient were reviewed by me and considered in my medical decision making (see chart for details).  Jeffrey Roberson was evaluated in Emergency Department on 11/11/2020 for the symptoms described in the history of present illness. He was evaluated in the context of the global COVID-19 pandemic, which necessitated consideration that the patient might be at risk for infection with the SARS-CoV-2 virus that causes COVID-19. Institutional protocols and algorithms that pertain to the evaluation of patients at risk for COVID-19 are in a state of rapid change based on information released by regulatory bodies including the CDC and federal and state organizations. These policies and algorithms were followed during the patient's care in the ED.   Patient presents with abdominal pain with some tenderness, clinically suspicious for gastritis.  CT scan unremarkable.  Hemoccult negative.  Labs normal.  Vital signs overall unremarkable.  Patient is ambulatory nontoxic.  Recommend he start on antacid therapy and follow-up with his gastroenterologist in about a week.      ____________________________________________   FINAL CLINICAL IMPRESSION(S) / ED DIAGNOSES    Final diagnoses:  Epigastric pain  Gastritis   ED Discharge Orders          Ordered    famotidine (PEPCID) 20 MG tablet  2 times daily        11/11/20 2122    sucralfate (CARAFATE) 1 g tablet  4 times daily        11/11/20 2122            Portions of this note were generated with dragon dictation software. Dictation errors may occur despite best attempts at proofreading.    Carrie Mew, MD 11/11/20 2127

## 2020-11-11 NOTE — ED Notes (Signed)
Patient transported to CT 

## 2020-11-11 NOTE — ED Triage Notes (Addendum)
Pt comes into the ED via POV c/o black tarry stools in his colostomy x 1 week.  Pt states his stool is black in color with an excessively foul odor.  Pt denies any blood thinner use.  Pt does have abdominal pain every times he eats and then will fill the colostomy bad shortly after.  Denies any h/o ulcers but states he has had diverticulitis.

## 2020-11-11 NOTE — ED Notes (Signed)
Patient stable and discharged with all personal belongings and AVS. AVS and discharge instructions reviewed with patient and opportunity for questions provided.   

## 2022-04-29 ENCOUNTER — Ambulatory Visit (HOSPITAL_COMMUNITY): Payer: Medicare Other | Admitting: Nurse Practitioner

## 2022-05-17 ENCOUNTER — Ambulatory Visit (HOSPITAL_COMMUNITY): Payer: Medicare Other | Admitting: Nurse Practitioner

## 2022-10-31 ENCOUNTER — Other Ambulatory Visit: Payer: Self-pay | Admitting: Family Medicine

## 2022-10-31 ENCOUNTER — Ambulatory Visit
Admission: RE | Admit: 2022-10-31 | Discharge: 2022-10-31 | Disposition: A | Discharge status: Home / Self Care | Payer: Medicare Other | Source: Ambulatory Visit | Attending: Family Medicine | Admitting: Family Medicine

## 2022-10-31 DIAGNOSIS — R42 Dizziness and giddiness: Secondary | ICD-10-CM

## 2022-10-31 DIAGNOSIS — R519 Headache, unspecified: Secondary | ICD-10-CM

## 2022-10-31 DIAGNOSIS — I10 Essential (primary) hypertension: Secondary | ICD-10-CM

## 2022-10-31 DIAGNOSIS — I2510 Atherosclerotic heart disease of native coronary artery without angina pectoris: Secondary | ICD-10-CM

## 2023-03-17 IMAGING — CT CT ABD-PELV W/ CM
2 of 5 series · 16 of 46 positions shown, 18 images · IV contrast (APPLIED)
Comparison: September 13, 2016

CLINICAL DATA: Abdominal pain.

EXAM:
CT ABDOMEN AND PELVIS WITH CONTRAST
TECHNIQUE: Multidetector CT imaging of the abdomen and pelvis was performed
using the standard protocol following bolus administration of
intravenous contrast.
CONTRAST:  80mL OMNIPAQUE IOHEXOL 350 MG/ML SOLN

[Series 2: axial st · axial · 0.79mm/px · z∈[-688,-233]mm · 13 of 103 slices shown, 15 images]
[im 6/103  soft-tissue]
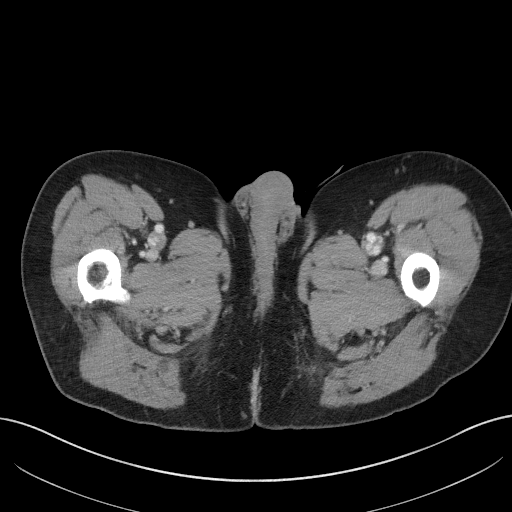
[im 6/103  bone]
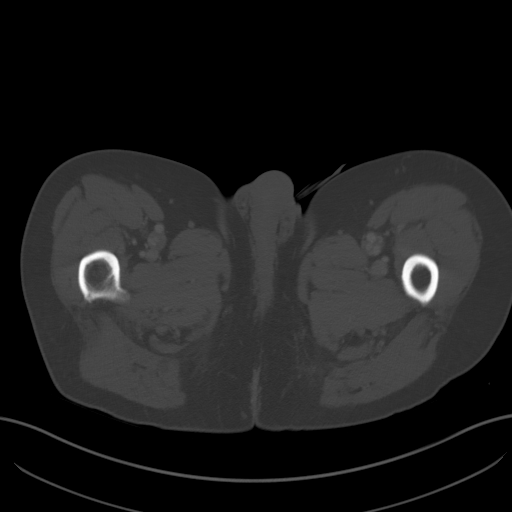
[im 12/103  soft-tissue]
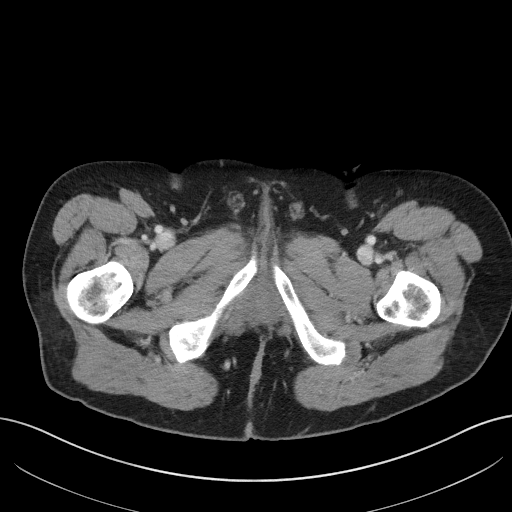
[im 23/103  soft-tissue]
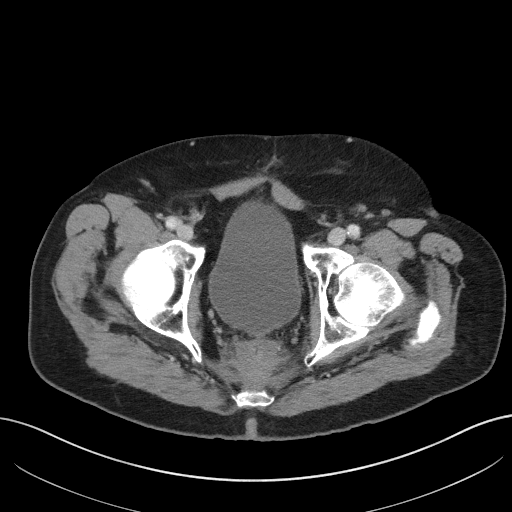
[im 29/103  soft-tissue]
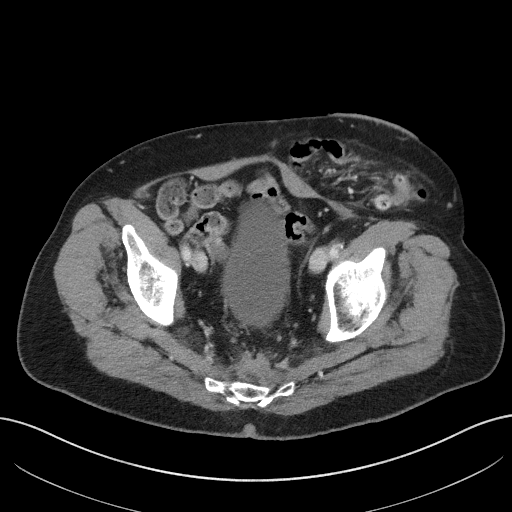
[im 35/103  soft-tissue]
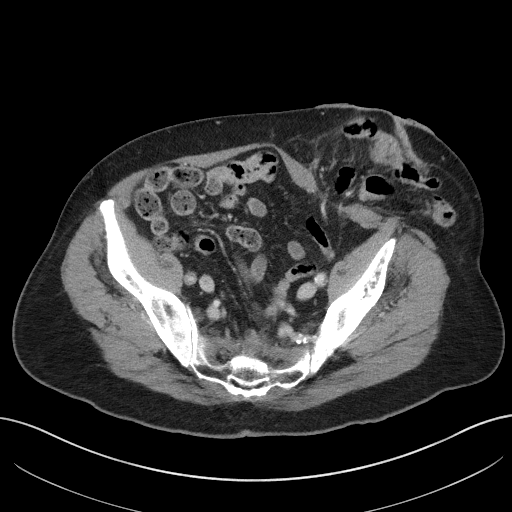
[im 46/103  soft-tissue]
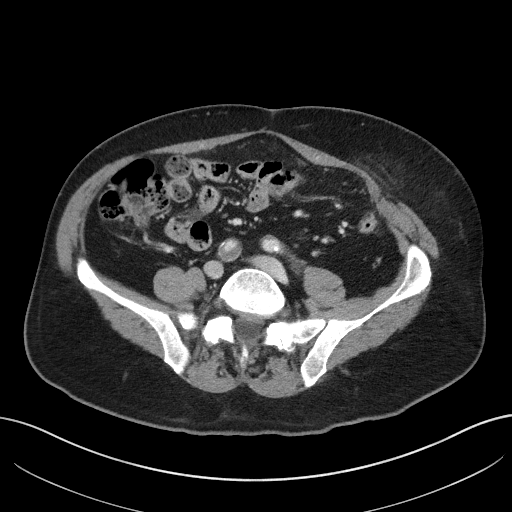
[im 52/103  soft-tissue]
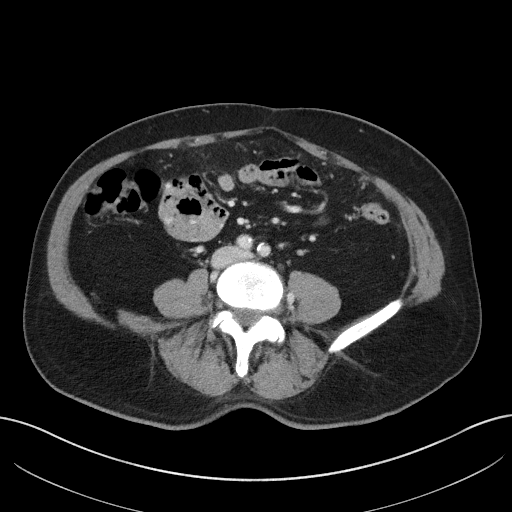
[im 57/103  soft-tissue]
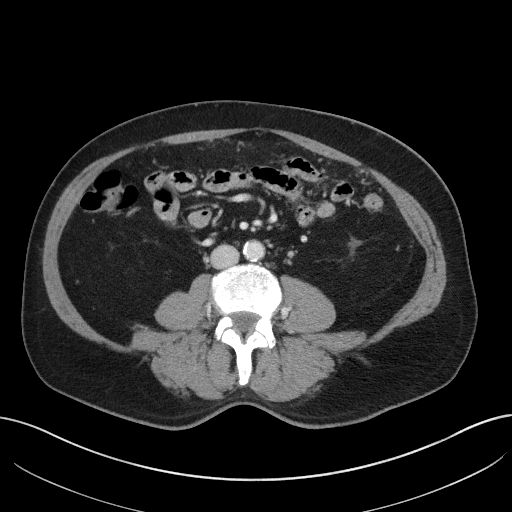
[im 69/103  soft-tissue]
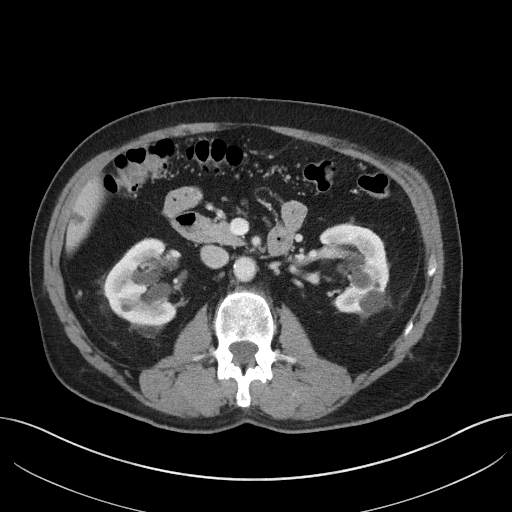
[im 69/103  bone]
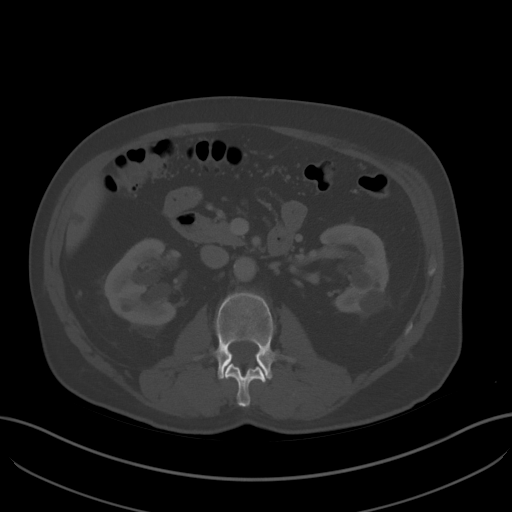
[im 74/103  soft-tissue]
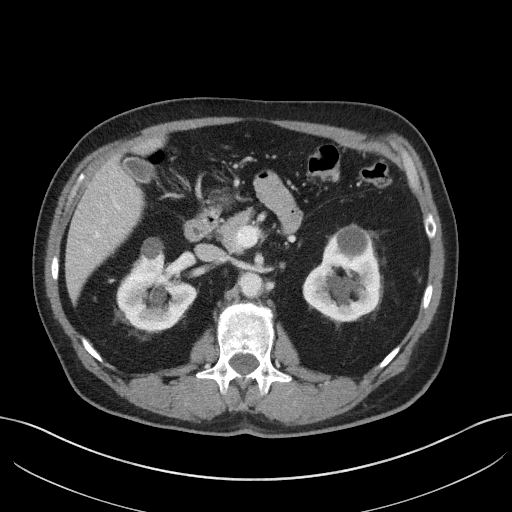
[im 80/103  soft-tissue]
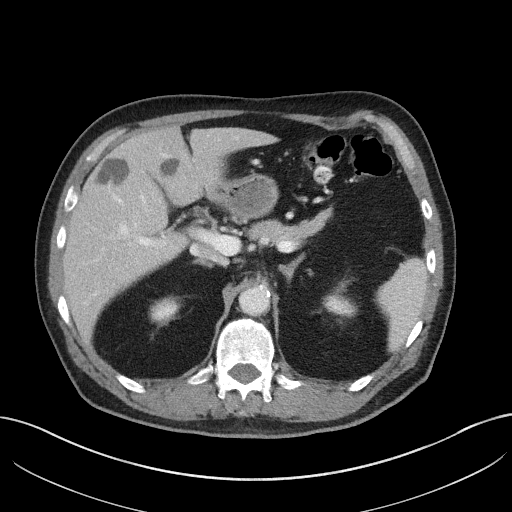
[im 91/103  soft-tissue]
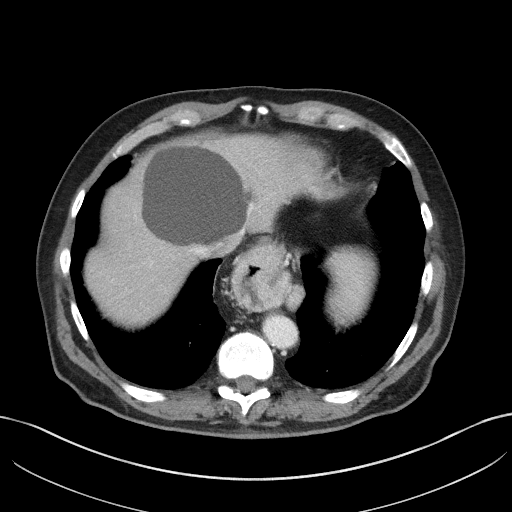
[im 97/103  soft-tissue]
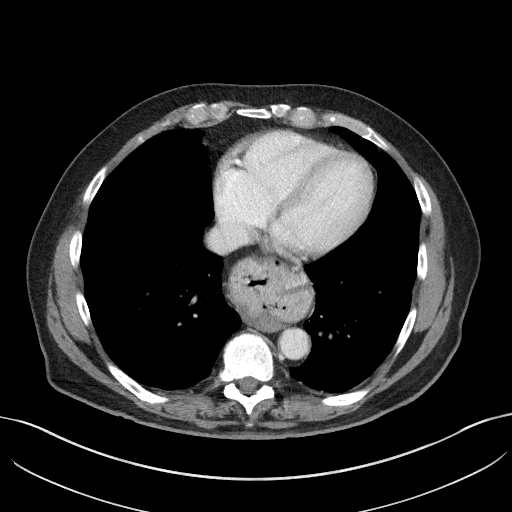

[Series 5: coronal st · coronal · 0.78mm/px · 3 of 92 slices shown]
[im 31/92  soft-tissue]
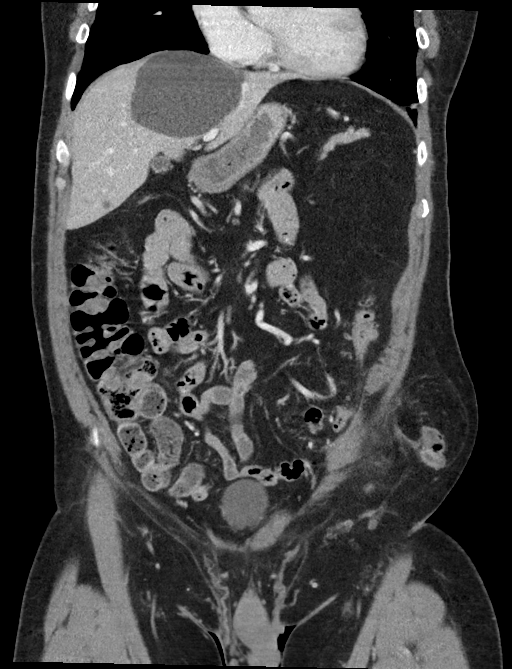
[im 41/92  soft-tissue]
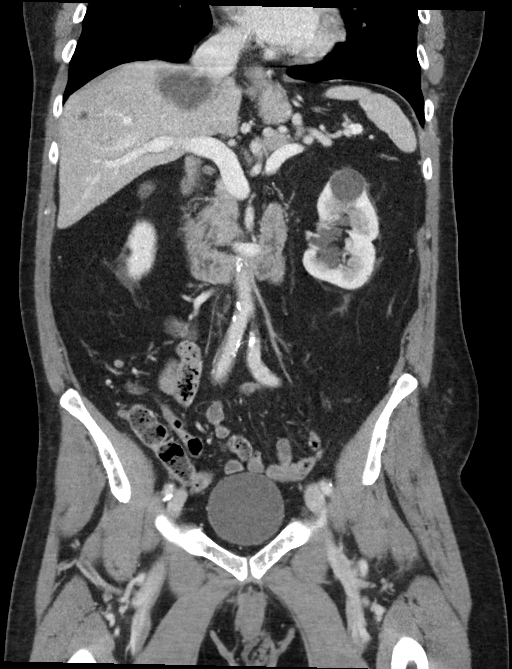
[im 51/92  soft-tissue]
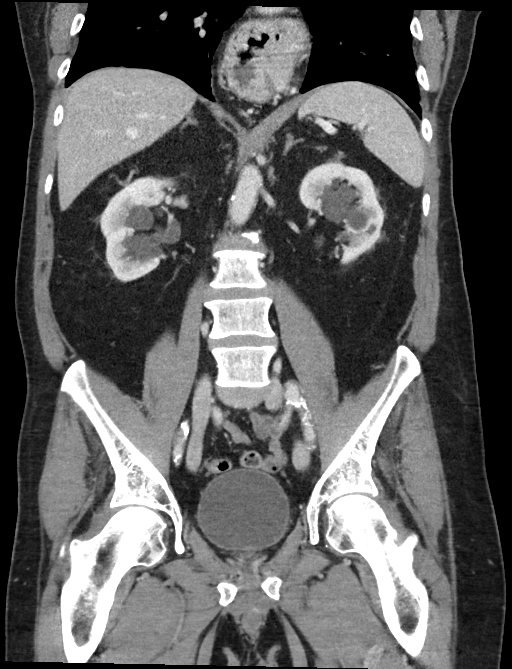

[16 of 46 positions shown; findings below may reference images not displayed]

FINDINGS: Lower chest: No acute abnormality.

Hepatobiliary: An 8.2 cm x 8.5 cm cyst is seen within the
anteromedial aspect of the right lobe of the liver. Multiple
additional smaller cysts are seen scattered throughout the liver
parenchyma. A 1.4 cm x 1.2 cm hyperdense focus is seen within the
posterior aspect of the right lobe of the liver. Tortuous enhancing
vessels are seen extending into this region. No gallstones,
gallbladder wall thickening, or biliary dilatation.

Pancreas: Unremarkable. No pancreatic ductal dilatation or
surrounding inflammatory changes.

Spleen: Normal in size without focal abnormality.

Adrenals/Urinary Tract: Adrenal glands are unremarkable. Kidneys are
normal in size, without renal calculi or hydronephrosis. Numerous
bilateral parenchymal and parapelvic simple renal cysts are seen.
Bladder is unremarkable.

Stomach/Bowel: There is a large hiatal hernia. The appendix is not
identified. No evidence of bowel wall thickening, distention, or
inflammatory changes. Noninflamed diverticula are seen within the
descending colon.

Vascular/Lymphatic: Aortic atherosclerosis. No enlarged abdominal or
pelvic lymph nodes.

Reproductive: Prostate gland is mildly enlarged.

Other: Postoperative changes are seen with a subsequent left lower
quadrant ostomy site.

No abdominopelvic ascites.

Musculoskeletal: No acute or significant osseous findings.
IMPRESSION: 1. Large hiatal hernia.
2. Multiple hepatic and bilateral simple renal cysts.
3. 1.4 cm x 1.2 cm hyperdense focus within the posterior aspect of
the right lobe of the liver which may represent a flash filling
hemangioma. Correlation with nonemergent follow-up hepatic MRI.
4. Colonic diverticulosis.
5. Findings consistent with history of prior proctocolectomy with
subsequent left lower quadrant ostomy site.

Aortic Atherosclerosis ([id number].C).

## 2023-04-21 DIAGNOSIS — Z933 Colostomy status: Secondary | ICD-10-CM | POA: Diagnosis not present

## 2023-05-03 DIAGNOSIS — E538 Deficiency of other specified B group vitamins: Secondary | ICD-10-CM | POA: Diagnosis not present

## 2023-05-19 DIAGNOSIS — Z933 Colostomy status: Secondary | ICD-10-CM | POA: Diagnosis not present

## 2023-06-05 DIAGNOSIS — E538 Deficiency of other specified B group vitamins: Secondary | ICD-10-CM | POA: Diagnosis not present

## 2023-06-27 DIAGNOSIS — I10 Essential (primary) hypertension: Secondary | ICD-10-CM | POA: Diagnosis not present

## 2023-06-27 DIAGNOSIS — R002 Palpitations: Secondary | ICD-10-CM | POA: Diagnosis not present

## 2023-06-27 DIAGNOSIS — E7800 Pure hypercholesterolemia, unspecified: Secondary | ICD-10-CM | POA: Diagnosis not present

## 2023-06-27 DIAGNOSIS — I4729 Other ventricular tachycardia: Secondary | ICD-10-CM | POA: Diagnosis not present

## 2023-06-27 DIAGNOSIS — I493 Ventricular premature depolarization: Secondary | ICD-10-CM | POA: Diagnosis not present

## 2023-06-27 DIAGNOSIS — I2510 Atherosclerotic heart disease of native coronary artery without angina pectoris: Secondary | ICD-10-CM | POA: Diagnosis not present

## 2023-07-05 DIAGNOSIS — Z933 Colostomy status: Secondary | ICD-10-CM | POA: Diagnosis not present

## 2023-07-25 DIAGNOSIS — R7309 Other abnormal glucose: Secondary | ICD-10-CM | POA: Diagnosis not present

## 2023-07-25 DIAGNOSIS — Z125 Encounter for screening for malignant neoplasm of prostate: Secondary | ICD-10-CM | POA: Diagnosis not present

## 2023-07-25 DIAGNOSIS — Z79899 Other long term (current) drug therapy: Secondary | ICD-10-CM | POA: Diagnosis not present

## 2023-07-25 DIAGNOSIS — I10 Essential (primary) hypertension: Secondary | ICD-10-CM | POA: Diagnosis not present

## 2023-07-25 DIAGNOSIS — E538 Deficiency of other specified B group vitamins: Secondary | ICD-10-CM | POA: Diagnosis not present

## 2023-07-25 DIAGNOSIS — E7800 Pure hypercholesterolemia, unspecified: Secondary | ICD-10-CM | POA: Diagnosis not present

## 2023-07-25 DIAGNOSIS — R002 Palpitations: Secondary | ICD-10-CM | POA: Diagnosis not present

## 2023-08-01 DIAGNOSIS — H2513 Age-related nuclear cataract, bilateral: Secondary | ICD-10-CM | POA: Diagnosis not present

## 2023-08-01 DIAGNOSIS — Z933 Colostomy status: Secondary | ICD-10-CM | POA: Diagnosis not present

## 2023-08-03 DIAGNOSIS — I2510 Atherosclerotic heart disease of native coronary artery without angina pectoris: Secondary | ICD-10-CM | POA: Diagnosis not present

## 2023-08-03 DIAGNOSIS — Z0000 Encounter for general adult medical examination without abnormal findings: Secondary | ICD-10-CM | POA: Diagnosis not present

## 2023-08-03 DIAGNOSIS — I10 Essential (primary) hypertension: Secondary | ICD-10-CM | POA: Diagnosis not present

## 2023-08-03 DIAGNOSIS — E782 Mixed hyperlipidemia: Secondary | ICD-10-CM | POA: Diagnosis not present

## 2023-08-03 DIAGNOSIS — E538 Deficiency of other specified B group vitamins: Secondary | ICD-10-CM | POA: Diagnosis not present

## 2023-09-18 DIAGNOSIS — Z933 Colostomy status: Secondary | ICD-10-CM | POA: Diagnosis not present

## 2023-10-19 DIAGNOSIS — Z933 Colostomy status: Secondary | ICD-10-CM | POA: Diagnosis not present

## 2023-11-17 DIAGNOSIS — Z933 Colostomy status: Secondary | ICD-10-CM | POA: Diagnosis not present

## 2023-12-18 DIAGNOSIS — Z933 Colostomy status: Secondary | ICD-10-CM | POA: Diagnosis not present

## 2023-12-27 DIAGNOSIS — E538 Deficiency of other specified B group vitamins: Secondary | ICD-10-CM | POA: Diagnosis not present

## 2023-12-27 DIAGNOSIS — R002 Palpitations: Secondary | ICD-10-CM | POA: Diagnosis not present

## 2023-12-27 DIAGNOSIS — E782 Mixed hyperlipidemia: Secondary | ICD-10-CM | POA: Diagnosis not present

## 2023-12-27 DIAGNOSIS — K219 Gastro-esophageal reflux disease without esophagitis: Secondary | ICD-10-CM | POA: Diagnosis not present

## 2023-12-27 DIAGNOSIS — Z79899 Other long term (current) drug therapy: Secondary | ICD-10-CM | POA: Diagnosis not present

## 2023-12-27 DIAGNOSIS — I10 Essential (primary) hypertension: Secondary | ICD-10-CM | POA: Diagnosis not present

## 2023-12-27 DIAGNOSIS — Z23 Encounter for immunization: Secondary | ICD-10-CM | POA: Diagnosis not present

## 2023-12-27 DIAGNOSIS — Z0000 Encounter for general adult medical examination without abnormal findings: Secondary | ICD-10-CM | POA: Diagnosis not present

## 2023-12-27 DIAGNOSIS — Z1331 Encounter for screening for depression: Secondary | ICD-10-CM | POA: Diagnosis not present

## 2023-12-27 DIAGNOSIS — Z125 Encounter for screening for malignant neoplasm of prostate: Secondary | ICD-10-CM | POA: Diagnosis not present

## 2024-01-08 DIAGNOSIS — I2510 Atherosclerotic heart disease of native coronary artery without angina pectoris: Secondary | ICD-10-CM | POA: Diagnosis not present

## 2024-01-08 DIAGNOSIS — I10 Essential (primary) hypertension: Secondary | ICD-10-CM | POA: Diagnosis not present

## 2024-01-08 DIAGNOSIS — I493 Ventricular premature depolarization: Secondary | ICD-10-CM | POA: Diagnosis not present

## 2024-01-08 DIAGNOSIS — R002 Palpitations: Secondary | ICD-10-CM | POA: Diagnosis not present

## 2024-01-08 DIAGNOSIS — E7800 Pure hypercholesterolemia, unspecified: Secondary | ICD-10-CM | POA: Diagnosis not present

## 2024-01-08 DIAGNOSIS — I4729 Other ventricular tachycardia: Secondary | ICD-10-CM | POA: Diagnosis not present

## 2024-01-11 DIAGNOSIS — J40 Bronchitis, not specified as acute or chronic: Secondary | ICD-10-CM | POA: Diagnosis not present

## 2024-01-11 DIAGNOSIS — I10 Essential (primary) hypertension: Secondary | ICD-10-CM | POA: Diagnosis not present

## 2024-01-11 DIAGNOSIS — Z933 Colostomy status: Secondary | ICD-10-CM | POA: Diagnosis not present
